# Patient Record
Sex: Female | Born: 1940 | Race: White | Hispanic: No | Marital: Married | Smoking: Never smoker
Health system: Southern US, Community
[De-identification: ages and names within clinical notes are randomized; demographics above are authoritative.]

## PROBLEM LIST (undated history)

## (undated) DIAGNOSIS — J309 Allergic rhinitis, unspecified: Secondary | ICD-10-CM

## (undated) DIAGNOSIS — C801 Malignant (primary) neoplasm, unspecified: Secondary | ICD-10-CM

## (undated) DIAGNOSIS — M8580 Other specified disorders of bone density and structure, unspecified site: Secondary | ICD-10-CM

## (undated) DIAGNOSIS — I10 Essential (primary) hypertension: Secondary | ICD-10-CM

## (undated) DIAGNOSIS — Z85850 Personal history of malignant neoplasm of thyroid: Secondary | ICD-10-CM

## (undated) DIAGNOSIS — T884XXA Failed or difficult intubation, initial encounter: Secondary | ICD-10-CM

## (undated) DIAGNOSIS — R0600 Dyspnea, unspecified: Secondary | ICD-10-CM

## (undated) DIAGNOSIS — K219 Gastro-esophageal reflux disease without esophagitis: Secondary | ICD-10-CM

## (undated) DIAGNOSIS — T8859XA Other complications of anesthesia, initial encounter: Secondary | ICD-10-CM

## (undated) DIAGNOSIS — T4145XA Adverse effect of unspecified anesthetic, initial encounter: Secondary | ICD-10-CM

## (undated) DIAGNOSIS — E785 Hyperlipidemia, unspecified: Secondary | ICD-10-CM

## (undated) HISTORY — DX: Hyperlipidemia, unspecified: E78.5

## (undated) HISTORY — DX: Other specified disorders of bone density and structure, unspecified site: M85.80

## (undated) HISTORY — PX: TONSILLECTOMY: SUR1361

## (undated) HISTORY — PX: INNER EAR SURGERY: SHX679

## (undated) HISTORY — DX: Allergic rhinitis, unspecified: J30.9

---

## 1978-03-15 HISTORY — PX: ABDOMINAL HYSTERECTOMY: SHX81

## 1984-11-13 HISTORY — PX: CHOLECYSTECTOMY: SHX55

## 2012-03-15 HISTORY — PX: COLONOSCOPY: SHX174

## 2015-04-21 ENCOUNTER — Other Ambulatory Visit: Payer: Self-pay | Admitting: Endocrinology

## 2015-04-21 DIAGNOSIS — E049 Nontoxic goiter, unspecified: Secondary | ICD-10-CM

## 2015-05-09 ENCOUNTER — Ambulatory Visit
Admission: RE | Admit: 2015-05-09 | Discharge: 2015-05-09 | Disposition: A | Discharge status: Home / Self Care | Payer: Self-pay | Source: Ambulatory Visit | Attending: Endocrinology | Admitting: Endocrinology

## 2015-05-09 DIAGNOSIS — E049 Nontoxic goiter, unspecified: Secondary | ICD-10-CM

## 2015-07-22 ENCOUNTER — Other Ambulatory Visit: Payer: Self-pay | Admitting: Endocrinology

## 2015-07-22 DIAGNOSIS — E063 Autoimmune thyroiditis: Secondary | ICD-10-CM

## 2015-07-22 DIAGNOSIS — E042 Nontoxic multinodular goiter: Secondary | ICD-10-CM

## 2015-07-25 ENCOUNTER — Inpatient Hospital Stay: Admission: RE | Admit: 2015-07-25 | Payer: Self-pay | Source: Ambulatory Visit

## 2015-07-25 ENCOUNTER — Encounter (INDEPENDENT_AMBULATORY_CARE_PROVIDER_SITE_OTHER): Payer: Self-pay

## 2015-07-25 ENCOUNTER — Ambulatory Visit
Admission: RE | Admit: 2015-07-25 | Discharge: 2015-07-25 | Disposition: A | Discharge status: Home / Self Care | Payer: Medicare Other | Source: Ambulatory Visit | Attending: Endocrinology | Admitting: Endocrinology

## 2015-07-25 ENCOUNTER — Other Ambulatory Visit (HOSPITAL_COMMUNITY)
Admission: RE | Admit: 2015-07-25 | Discharge: 2015-07-25 | Disposition: A | Discharge status: Home / Self Care | Payer: Medicare Other | Source: Ambulatory Visit | Attending: Radiology | Admitting: Radiology

## 2015-07-25 DIAGNOSIS — E063 Autoimmune thyroiditis: Secondary | ICD-10-CM

## 2015-07-25 DIAGNOSIS — E042 Nontoxic multinodular goiter: Secondary | ICD-10-CM | POA: Diagnosis present

## 2015-07-25 DIAGNOSIS — C73 Malignant neoplasm of thyroid gland: Secondary | ICD-10-CM | POA: Insufficient documentation

## 2015-07-30 ENCOUNTER — Inpatient Hospital Stay: Admission: RE | Admit: 2015-07-30 | Payer: Self-pay | Source: Ambulatory Visit

## 2015-08-19 ENCOUNTER — Ambulatory Visit: Payer: Self-pay | Admitting: Surgery

## 2015-08-21 ENCOUNTER — Encounter (HOSPITAL_COMMUNITY): Payer: Self-pay

## 2015-08-21 NOTE — Progress Notes (Signed)
Anesthesia records obtained from surgeries 2014, 2016 obtained and review by Dr Seward Speck who stated they did not need to see pt at PST visit sine records reviewed. Placed on chart

## 2015-08-21 NOTE — Patient Instructions (Addendum)
Kimberlly Hubanks  08/21/2015   Your procedure is scheduled on: 08/28/15 Thursday  Report to [facility] Main  Entrance take [facility]  elevators to 3rd floor to  [place] @  0800 AM.  Call this number if you have problems the morning of surgery [phone]   Remember: ONLY 1 PERSON MAY GO WITH YOU TO SHORT STAY TO GET  READY MORNING OF [place].  Do not eat food or drink liquids :After Midnight. Wednesday night   STOP ASPIRIN, ANTIINFLAMMATORIES AND HERBAL MEDICINE 5 DAYS BEFORE SURGERY AS PER DR GERKINS ORDER  Take these medicines the morning of surgery with A SIP OF WATER: ZANTAC,   May take Allegra, may use refresh eye drops if needed DO NOT [place] may not have any metal on your body including hair pins and              piercings  Do not wear jewelry, make-up, lotions, powders or perfumes, deodorant             Do not wear nail polish.  Do not shave  48 hours prior to surgery.              Men may shave face and neck.   Do not bring valuables to the hospital. [place].  Contacts, dentures or bridgework may not be worn into surgery.  Leave suitcase in the car. After surgery it may be brought to your room.     Patients discharged the day of surgery will not be allowed to drive home.  Name and phone number of your driver:husband- Phil  Special Instructions: N/A              Please read over the following fact sheets you were given: _____________________________________________________________________             [facility] - Preparing for Surgery Before surgery, you can play an important role.  Because skin is not sterile, your skin needs to be as free of germs as possible.  You can reduce the number of germs on your skin by washing with CHG (chlorahexidine gluconate) soap before surgery.  CHG is an antiseptic cleaner which  kills germs and bonds with the skin to continue killing germs even after washing. Please DO NOT use if you have an allergy to CHG or antibacterial soaps.  If your skin becomes reddened/irritated stop using the CHG and inform your nurse when you arrive at Short Stay. Do not shave (including legs and underarms) for at least 48 hours prior to the first CHG shower.  You may shave your face/neck. Please follow these instructions carefully:  1.  Shower with CHG Soap the night before surgery and the  morning of Surgery.  2.  If you choose to wash your hair, wash your hair first as usual with your  normal  shampoo.  3.  After you shampoo, rinse your hair and body thoroughly to remove the  shampoo.  4.  Use CHG as you would any other liquid soap.  You can apply chg directly  to the skin and wash                       Gently with a scrungie or clean washcloth.  5.  Apply the CHG Soap to your body ONLY FROM THE NECK DOWN.   Do not use on face/ open                           Wound or open sores. Avoid contact with eyes, ears mouth and genitals (private parts).                       Wash face,  Genitals (private parts) with your normal soap.             6.  Wash thoroughly, paying special attention to the area where your surgery  will be performed.  7.  Thoroughly rinse your body with warm water from the neck down.  8.  DO NOT shower/wash with your normal soap after using and rinsing off  the CHG Soap.                9.  Pat yourself dry with a clean towel.            10.  Wear clean pajamas.            11.  Place clean sheets on your bed the night of your first shower and do not  sleep with pets. Day of Surgery : Do not apply any lotions/deodorants the morning of surgery.  Please wear clean clothes to the hospital/surgery center.  FAILURE TO FOLLOW THESE INSTRUCTIONS MAY RESULT IN THE CANCELLATION OF YOUR SURGERY PATIENT SIGNATURE_________________________________  NURSE  SIGNATURE__________________________________  ________________________________________________________________________

## 2015-08-25 ENCOUNTER — Encounter (HOSPITAL_COMMUNITY): Payer: Self-pay

## 2015-08-25 ENCOUNTER — Ambulatory Visit (HOSPITAL_COMMUNITY)
Admission: RE | Admit: 2015-08-25 | Discharge: 2015-08-25 | Disposition: A | Discharge status: Home / Self Care | Payer: Medicare Other | Source: Ambulatory Visit | Attending: Anesthesiology | Admitting: Anesthesiology

## 2015-08-25 ENCOUNTER — Encounter (HOSPITAL_COMMUNITY)
Admission: RE | Admit: 2015-08-25 | Discharge: 2015-08-25 | Disposition: A | Discharge status: Home / Self Care | Payer: Medicare Other | Source: Ambulatory Visit | Attending: Surgery | Admitting: Surgery

## 2015-08-25 DIAGNOSIS — Z01818 Encounter for other preprocedural examination: Secondary | ICD-10-CM | POA: Diagnosis not present

## 2015-08-25 DIAGNOSIS — I10 Essential (primary) hypertension: Secondary | ICD-10-CM | POA: Insufficient documentation

## 2015-08-25 HISTORY — DX: Malignant (primary) neoplasm, unspecified: C80.1

## 2015-08-25 HISTORY — DX: Adverse effect of unspecified anesthetic, initial encounter: T41.45XA

## 2015-08-25 HISTORY — DX: Essential (primary) hypertension: I10

## 2015-08-25 HISTORY — DX: Gastro-esophageal reflux disease without esophagitis: K21.9

## 2015-08-25 HISTORY — DX: Other complications of anesthesia, initial encounter: T88.59XA

## 2015-08-25 LAB — BASIC METABOLIC PANEL
Anion gap: 7 (ref 5–15)
BUN: 19 mg/dL (ref 6–20)
CO2: 26 mmol/L (ref 22–32)
CREATININE: 0.56 mg/dL (ref 0.44–1.00)
Calcium: 9.8 mg/dL (ref 8.9–10.3)
Chloride: 108 mmol/L (ref 101–111)
GFR calc Af Amer: >60 mL/min (ref >60)
GFR calc non Af Amer: >60 mL/min (ref >60)
GLUCOSE: 99 mg/dL (ref 65–99)
Potassium: 4 mmol/L (ref 3.5–5.1)
SODIUM: 141 mmol/L (ref 135–145)

## 2015-08-25 LAB — CBC
HCT: 41.4 % (ref 36.0–46.0)
Hemoglobin: 12.8 g/dL (ref 12.0–15.0)
MCH: 28.2 pg (ref 26.0–34.0)
MCHC: 30.9 g/dL (ref 30.0–36.0)
MCV: 91.2 fL (ref 78.0–100.0)
PLATELETS: 173 10*3/uL (ref 150–400)
RBC: 4.54 MIL/uL (ref 3.87–5.11)
RDW: 14.4 % (ref 11.5–15.5)
WBC: 7.2 10*3/uL (ref 4.0–10.5)

## 2015-08-27 ENCOUNTER — Encounter (HOSPITAL_COMMUNITY): Payer: Self-pay | Admitting: Surgery

## 2015-08-27 DIAGNOSIS — C73 Malignant neoplasm of thyroid gland: Secondary | ICD-10-CM | POA: Diagnosis present

## 2015-08-27 DIAGNOSIS — E042 Nontoxic multinodular goiter: Secondary | ICD-10-CM | POA: Diagnosis present

## 2015-08-27 NOTE — H&P (Signed)
General Surgery [facility]  Haley [name] DOB: Apr 02, 1940 Married / Language: English / Race: White Female  History of Present Illness  The patient is a 75 year old female who presents with thyroid cancer.  Patient is referred by Dr. Legrand Como [place] 4 evaluation and treatment of papillary thyroid carcinoma. Patient had been noted with abnormal thyroid function test by her primary care physician, Dr. Kennith Maes in [facility]. Patient was referred to her endocrinologist for further evaluation and recommendations. Patient was sent for thyroid ultrasound in February 2017. She was noted to have bilateral thyroid nodules. A nodule on the left side measured 1.9 cm in size, was hypoechoic, and contained internal dystrophic calcifications. Biopsy was recommended. Biopsy was performed on Jul 25, 2015. This was consistent with papillary thyroid carcinoma, Bethesda category VI. Patient has no prior history of thyroid disease. She has had previous bilateral ear surgery by Dr. Vicie Mutters. She has never been on thyroid medication. There is no family history of thyroid cancer. Patient denies tremors or palpitations. She has no compressive symptoms. She denies hoarseness. Patient is accompanied by her husband who is a previous patient of mine having undergone parathyroidectomy many years ago.   Other Problems Cancer Cholelithiasis High blood pressure Hypercholesterolemia Migraine Headache Thyroid Disease  Past Surgical History Appendectomy Gallbladder Surgery - Open Hysterectomy (not due to cancer) - Complete Tonsillectomy  Diagnostic Studies History Colonoscopy 1-5 years ago Mammogram within last year Pap Smear >5 years ago  Allergies Codeine Sulfate *ANALGESICS - OPIOID* Tetanus Toxoid Adsorbed *Toxoids**  Medication History Captopril (25MG  Tablet, Oral) Active. Allegra (180MG  Tablet, Oral) Active. Crestor (20MG   Tablet, Oral) Active. Vitamin D (Ergocalciferol) (50000UNIT Capsule, Oral) Active. Minocycline HCl (100MG  Tablet, Oral) Active. Medications Reconciled  Social History Caffeine use Carbonated beverages, Coffee, Tea. No alcohol use No drug use Tobacco use Never smoker.  Family History Heart Disease Father.  Pregnancy / Birth History Age at menarche 59 years. Age of menopause <46 Gravida 4 Maternal age 109-25 Para 4  Review of Systems General Not Present- Appetite Loss, Chills, Fatigue, Fever, Night Sweats, Weight Gain and Weight Loss. Skin Not Present- Change in Wart/Mole, Dryness, Hives, Jaundice, New Lesions, Non-Healing Wounds, Rash and Ulcer. HEENT Present- Seasonal Allergies. Not Present- Earache, Hearing Loss, Hoarseness, Nose Bleed, Oral Ulcers, Ringing in the Ears, Sinus Pain, Sore Throat, Visual Disturbances, Wears glasses/contact lenses and Yellow Eyes. Respiratory Not Present- Bloody sputum, Chronic Cough, Difficulty Breathing, Snoring and Wheezing. Breast Not Present- Breast Mass, Breast Pain, Nipple Discharge and Skin Changes. Cardiovascular Not Present- Chest Pain, Difficulty Breathing Lying Down, Leg Cramps, Palpitations, Rapid Heart Rate, Shortness of Breath and Swelling of Extremities. Gastrointestinal Not Present- Abdominal Pain, Bloating, Bloody Stool, Change in Bowel Habits, Chronic diarrhea, Constipation, Difficulty Swallowing, Excessive gas, Gets full quickly at meals, Hemorrhoids, Indigestion, Nausea, Rectal Pain and Vomiting. Female Genitourinary Not Present- Frequency, Nocturia, Painful Urination, Pelvic Pain and Urgency. Musculoskeletal Not Present- Back Pain, Joint Pain, Joint Stiffness, Muscle Pain, Muscle Weakness and Swelling of Extremities. Neurological Not Present- Decreased Memory, Fainting, Headaches, Numbness, Seizures, Tingling, Tremor, Trouble walking and Weakness. Psychiatric Not Present- Anxiety, Bipolar, Change in Sleep Pattern,  Depression, Fearful and Frequent crying. Endocrine Not Present- Cold Intolerance, Excessive Hunger, Hair Changes, Heat Intolerance, Hot flashes and New Diabetes. Hematology Not Present- Easy Bruising, Excessive bleeding, Gland problems, HIV and Persistent Infections.  Vitals Weight: 155 lb Height: 66in Body Surface Area: 1.79 m Body Mass Index: 25.02 kg/m  Temp.: 97.66F  Pulse:  76 (Regular)  BP: 130/70 (Sitting, Left Arm, Standard)  Physical Exam  General - appears comfortable, no distress; not diaphorectic  HEENT - normocephalic; sclerae clear, gaze conjugate; mucous membranes moist, dentition good; voice normal  Neck - asymmetric on extension; no palpable anterior or posterior cervical adenopathy; right lobe is slightly firm and mildly nodular without dominant or discrete mass; left lobe has a firm hard discrete mass in the inferior pole which is nontender, measures approximately 2-2.5 cm in size, and is mobile with swallowing  Chest - clear bilaterally without rhonchi, rales, or wheeze  Cor - regular rhythm with normal rate; no significant murmur  Ext - non-tender without significant edema or lymphedema  Neuro - grossly intact; no tremor  Assessment & Plan  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 Patient and her husband are provided with written literature on thyroid surgery to review at home.  Patient has a papillary thyroid carcinoma arising in the lower left thyroid lobe. She has multiple nodules throughout the thyroid. I have recommended proceeding with total thyroidectomy with limited central compartment left node dissection. Based on the pathologic results, we will make a decision with her endocrinologist as to whether or not radioactive iodine treatment is necessary. We discussed the procedure at length. We discussed risk and  benefits including the potential for recurrent laryngeal nerve injury and injury to parathyroid glands. We discussed the location of the surgical incision. We discussed the hospital stay. We discussed her postoperative recovery and need for lifelong thyroid hormone replacement. We discussed the potential need for radioactive iodine treatment. Patient and her husband understand and wish to proceed with surgery in the near future.  The risks and benefits of the procedure have been discussed at length with the patient. The patient understands the proposed procedure, potential alternative treatments, and the course of recovery to be expected. All of the patient's questions have been answered at this time. The patient wishes to proceed with surgery.  Earnstine Regal, MD, [facility] Office: [phone]

## 2015-08-28 ENCOUNTER — Observation Stay (HOSPITAL_COMMUNITY)
Admission: RE | Admit: 2015-08-28 | Discharge: 2015-08-29 | Disposition: A | Discharge status: Home / Self Care | Payer: Medicare Other | Source: Ambulatory Visit | Attending: Surgery | Admitting: Surgery

## 2015-08-28 ENCOUNTER — Encounter (HOSPITAL_COMMUNITY)
Admission: RE | Disposition: A | Discharge status: Home / Self Care | Payer: Self-pay | Source: Ambulatory Visit | Attending: Surgery

## 2015-08-28 ENCOUNTER — Ambulatory Visit (HOSPITAL_COMMUNITY): Payer: Medicare Other | Admitting: Anesthesiology

## 2015-08-28 ENCOUNTER — Encounter (HOSPITAL_COMMUNITY): Payer: Self-pay | Admitting: *Deleted

## 2015-08-28 DIAGNOSIS — Z79899 Other long term (current) drug therapy: Secondary | ICD-10-CM | POA: Diagnosis not present

## 2015-08-28 DIAGNOSIS — C73 Malignant neoplasm of thyroid gland: Secondary | ICD-10-CM | POA: Diagnosis present

## 2015-08-28 DIAGNOSIS — E042 Nontoxic multinodular goiter: Secondary | ICD-10-CM | POA: Diagnosis present

## 2015-08-28 DIAGNOSIS — K219 Gastro-esophageal reflux disease without esophagitis: Secondary | ICD-10-CM | POA: Diagnosis not present

## 2015-08-28 DIAGNOSIS — C770 Secondary and unspecified malignant neoplasm of lymph nodes of head, face and neck: Secondary | ICD-10-CM | POA: Diagnosis not present

## 2015-08-28 DIAGNOSIS — I10 Essential (primary) hypertension: Secondary | ICD-10-CM | POA: Insufficient documentation

## 2015-08-28 DIAGNOSIS — E7800 Pure hypercholesterolemia, unspecified: Secondary | ICD-10-CM | POA: Diagnosis not present

## 2015-08-28 HISTORY — PX: THYROIDECTOMY: SHX17

## 2015-08-28 SURGERY — THYROIDECTOMY
Anesthesia: General | Site: Neck

## 2015-08-28 MED ORDER — FENTANYL CITRATE (PF) 250 MCG/5ML IJ SOLN
INTRAMUSCULAR | Status: AC
Start: 2015-08-28 — End: 2015-08-28
  Filled 2015-08-28: qty 5

## 2015-08-28 MED ORDER — PROPOFOL 10 MG/ML IV BOLUS
INTRAVENOUS | Status: DC | PRN
  Administered 2015-08-28: 150 mg via INTRAVENOUS
  Administered 2015-08-28: 90 mg via INTRAVENOUS

## 2015-08-28 MED ORDER — CEFAZOLIN SODIUM-DEXTROSE 2-4 GM/100ML-% IV SOLN
2.0000 g | INTRAVENOUS | Status: AC
  Administered 2015-08-28: 2 g via INTRAVENOUS

## 2015-08-28 MED ORDER — MIDAZOLAM HCL 5 MG/5ML IJ SOLN
INTRAMUSCULAR | Status: DC | PRN
  Administered 2015-08-28 (×2): 1 mg via INTRAVENOUS

## 2015-08-28 MED ORDER — ONDANSETRON HCL 4 MG/2ML IJ SOLN
INTRAMUSCULAR | Status: DC | PRN
  Administered 2015-08-28: 4 mg via INTRAVENOUS

## 2015-08-28 MED ORDER — HYDROMORPHONE HCL 1 MG/ML IJ SOLN
1.0000 mg | INTRAMUSCULAR | Status: DC | PRN
  Administered 2015-08-28 (×2): 1 mg via INTRAVENOUS
  Filled 2015-08-28 (×2): qty 1

## 2015-08-28 MED ORDER — FENTANYL CITRATE (PF) 250 MCG/5ML IJ SOLN
INTRAMUSCULAR | Status: AC
  Filled 2015-08-28: qty 5

## 2015-08-28 MED ORDER — ACETAMINOPHEN 10 MG/ML IV SOLN
INTRAVENOUS | Status: DC | PRN
  Administered 2015-08-28: 1000 mg via INTRAVENOUS

## 2015-08-28 MED ORDER — FENTANYL CITRATE (PF) 100 MCG/2ML IJ SOLN
INTRAMUSCULAR | Status: DC | PRN
  Administered 2015-08-28 (×5): 50 ug via INTRAVENOUS
  Administered 2015-08-28 (×2): 100 ug via INTRAVENOUS
  Administered 2015-08-28: 50 ug via INTRAVENOUS

## 2015-08-28 MED ORDER — LABETALOL HCL 5 MG/ML IV SOLN
INTRAVENOUS | Status: AC
  Filled 2015-08-28: qty 4

## 2015-08-28 MED ORDER — 0.9 % SODIUM CHLORIDE (POUR BTL) OPTIME
TOPICAL | Status: DC | PRN
  Administered 2015-08-28: 1000 mL

## 2015-08-28 MED ORDER — LACTATED RINGERS IV SOLN
INTRAVENOUS | Status: DC
  Administered 2015-08-28: 1000 mL via INTRAVENOUS

## 2015-08-28 MED ORDER — SUGAMMADEX SODIUM 200 MG/2ML IV SOLN
INTRAVENOUS | Status: DC | PRN
  Administered 2015-08-28: 150 mg via INTRAVENOUS

## 2015-08-28 MED ORDER — ACETAMINOPHEN 325 MG PO TABS
650.0000 mg | ORAL_TABLET | Freq: Four times a day (QID) | ORAL | Status: DC | PRN
  Administered 2015-08-29: 650 mg via ORAL
  Filled 2015-08-28 (×2): qty 2

## 2015-08-28 MED ORDER — SUGAMMADEX SODIUM 200 MG/2ML IV SOLN
INTRAVENOUS | Status: AC
  Filled 2015-08-28: qty 2

## 2015-08-28 MED ORDER — ACETAMINOPHEN 10 MG/ML IV SOLN
INTRAVENOUS | Status: AC
  Filled 2015-08-28: qty 100

## 2015-08-28 MED ORDER — CALCIUM CARBONATE 1250 (500 CA) MG PO TABS
2.0000 | ORAL_TABLET | Freq: Three times a day (TID) | ORAL | Status: DC
  Administered 2015-08-28 – 2015-08-29 (×3): 1000 mg via ORAL
  Filled 2015-08-28 (×5): qty 2

## 2015-08-28 MED ORDER — PHENYLEPHRINE HCL 10 MG/ML IJ SOLN
INTRAMUSCULAR | Status: DC | PRN
  Administered 2015-08-28: 80 ug via INTRAVENOUS

## 2015-08-28 MED ORDER — MIDAZOLAM HCL 2 MG/2ML IJ SOLN
INTRAMUSCULAR | Status: AC
  Filled 2015-08-28: qty 2

## 2015-08-28 MED ORDER — PROPOFOL 10 MG/ML IV BOLUS
INTRAVENOUS | Status: AC
  Filled 2015-08-28: qty 40

## 2015-08-28 MED ORDER — CEFAZOLIN SODIUM-DEXTROSE 2-4 GM/100ML-% IV SOLN
INTRAVENOUS | Status: AC
  Filled 2015-08-28: qty 100

## 2015-08-28 MED ORDER — ROCURONIUM BROMIDE 100 MG/10ML IV SOLN
INTRAVENOUS | Status: AC
  Filled 2015-08-28: qty 1

## 2015-08-28 MED ORDER — ONDANSETRON HCL 4 MG/2ML IJ SOLN: 4.0000 mg | Freq: Four times a day (QID) | INTRAMUSCULAR | Status: DC | PRN

## 2015-08-28 MED ORDER — HYDROMORPHONE HCL 1 MG/ML IJ SOLN
INTRAMUSCULAR | Status: AC
  Filled 2015-08-28: qty 1

## 2015-08-28 MED ORDER — HYDROMORPHONE HCL 1 MG/ML IJ SOLN
0.2500 mg | INTRAMUSCULAR | Status: DC | PRN
  Administered 2015-08-28 (×2): 0.5 mg via INTRAVENOUS

## 2015-08-28 MED ORDER — LABETALOL HCL 5 MG/ML IV SOLN
INTRAVENOUS | Status: DC | PRN
  Administered 2015-08-28 (×3): 2.5 mg via INTRAVENOUS
  Administered 2015-08-28 (×2): 5 mg via INTRAVENOUS

## 2015-08-28 MED ORDER — ROCURONIUM BROMIDE 100 MG/10ML IV SOLN
INTRAVENOUS | Status: DC | PRN
  Administered 2015-08-28: 20 mg via INTRAVENOUS
  Administered 2015-08-28: 30 mg via INTRAVENOUS

## 2015-08-28 MED ORDER — KCL IN DEXTROSE-NACL 20-5-0.45 MEQ/L-%-% IV SOLN
INTRAVENOUS | Status: DC
  Administered 2015-08-28 – 2015-08-29 (×2): via INTRAVENOUS
  Filled 2015-08-28 (×2): qty 1000

## 2015-08-28 MED ORDER — LACTATED RINGERS IV SOLN: INTRAVENOUS | Status: DC

## 2015-08-28 MED ORDER — DEXAMETHASONE SODIUM PHOSPHATE 10 MG/ML IJ SOLN
INTRAMUSCULAR | Status: AC
  Filled 2015-08-28: qty 1

## 2015-08-28 MED ORDER — ONDANSETRON HCL 4 MG/2ML IJ SOLN
INTRAMUSCULAR | Status: AC
  Filled 2015-08-28: qty 2

## 2015-08-28 MED ORDER — CAPTOPRIL 25 MG PO TABS
25.0000 mg | ORAL_TABLET | Freq: Three times a day (TID) | ORAL | Status: DC
  Administered 2015-08-28 – 2015-08-29 (×4): 25 mg via ORAL
  Filled 2015-08-28 (×5): qty 1

## 2015-08-28 MED ORDER — FENTANYL CITRATE (PF) 100 MCG/2ML IJ SOLN: 25.0000 ug | INTRAMUSCULAR | Status: DC | PRN

## 2015-08-28 MED ORDER — LIDOCAINE HCL (CARDIAC) 20 MG/ML IV SOLN
INTRAVENOUS | Status: AC
  Filled 2015-08-28: qty 5

## 2015-08-28 MED ORDER — SUCCINYLCHOLINE CHLORIDE 20 MG/ML IJ SOLN
INTRAMUSCULAR | Status: DC | PRN
  Administered 2015-08-28: 100 mg via INTRAVENOUS
  Administered 2015-08-28: 40 mg via INTRAVENOUS

## 2015-08-28 MED ORDER — LIDOCAINE HCL (CARDIAC) 20 MG/ML IV SOLN
INTRAVENOUS | Status: DC | PRN
  Administered 2015-08-28: 25 mg via INTRATRACHEAL
  Administered 2015-08-28: 150 mg via INTRAVENOUS

## 2015-08-28 MED ORDER — ONDANSETRON 4 MG PO TBDP: 4.0000 mg | ORAL_TABLET | Freq: Four times a day (QID) | ORAL | Status: DC | PRN

## 2015-08-28 MED ORDER — DEXAMETHASONE SODIUM PHOSPHATE 10 MG/ML IJ SOLN
INTRAMUSCULAR | Status: DC | PRN
  Administered 2015-08-28: 10 mg via INTRAVENOUS

## 2015-08-28 MED ORDER — HYDROCODONE-ACETAMINOPHEN 5-325 MG PO TABS
1.0000 | ORAL_TABLET | ORAL | Status: DC | PRN
  Administered 2015-08-28 – 2015-08-29 (×2): 2 via ORAL
  Filled 2015-08-28 (×2): qty 2

## 2015-08-28 MED ORDER — ACETAMINOPHEN 650 MG RE SUPP: 650.0000 mg | Freq: Four times a day (QID) | RECTAL | Status: DC | PRN

## 2015-08-28 MED ORDER — LACTATED RINGERS IV SOLN
INTRAVENOUS | Status: DC | PRN
  Administered 2015-08-28 (×2): via INTRAVENOUS

## 2015-08-28 SURGICAL SUPPLY — 39 items
ATTRACTOMAT 16X20 MAGNETIC DRP (DRAPES) ×3 IMPLANT
BENZOIN TINCTURE PRP APPL 2/3 (GAUZE/BANDAGES/DRESSINGS) ×3 IMPLANT
BLADE HEX COATED 2.75 (ELECTRODE) ×3 IMPLANT
BLADE SURG 15 STRL LF DISP TIS (BLADE) ×1 IMPLANT
BLADE SURG 15 STRL SS (BLADE) ×2
CHLORAPREP W/TINT 26ML (MISCELLANEOUS) ×3 IMPLANT
CLIP TI MEDIUM 6 (CLIP) ×6 IMPLANT
CLIP TI WIDE RED SMALL 6 (CLIP) ×9 IMPLANT
CLOSURE WOUND 1/2 X4 (GAUZE/BANDAGES/DRESSINGS) ×1
COVER SURGICAL LIGHT HANDLE (MISCELLANEOUS) ×3 IMPLANT
DISSECTOR ROUND CHERRY 3/8 STR (MISCELLANEOUS) IMPLANT
DRAPE LAPAROTOMY T 98X78 PEDS (DRAPES) ×3 IMPLANT
DRESSING SURGICEL FIBRLLR 1X2 (HEMOSTASIS) ×1 IMPLANT
DRSG SURGICEL FIBRILLAR 1X2 (HEMOSTASIS) ×3
ELECT PENCIL ROCKER SW 15FT (MISCELLANEOUS) ×3 IMPLANT
ELECT REM PT RETURN 9FT ADLT (ELECTROSURGICAL) ×3
ELECTRODE REM PT RTRN 9FT ADLT (ELECTROSURGICAL) ×1 IMPLANT
GAUZE SPONGE 4X4 12PLY STRL (GAUZE/BANDAGES/DRESSINGS) ×3 IMPLANT
GAUZE SPONGE 4X4 16PLY XRAY LF (GAUZE/BANDAGES/DRESSINGS) ×6 IMPLANT
GLOVE SURG ORTHO 8.0 STRL STRW (GLOVE) ×3 IMPLANT
GOWN STRL REUS W/TWL XL LVL3 (GOWN DISPOSABLE) ×6 IMPLANT
KIT BASIN OR (CUSTOM PROCEDURE TRAY) ×3 IMPLANT
LIGHT WAVEGUIDE WIDE FLAT (MISCELLANEOUS) ×3 IMPLANT
LIQUID BAND (GAUZE/BANDAGES/DRESSINGS) IMPLANT
PACK BASIC VI WITH GOWN DISP (CUSTOM PROCEDURE TRAY) ×3 IMPLANT
SHEARS HARMONIC 9CM CVD (BLADE) ×3 IMPLANT
STAPLER VISISTAT 35W (STAPLE) IMPLANT
STRIP CLOSURE SKIN 1/2X4 (GAUZE/BANDAGES/DRESSINGS) ×2 IMPLANT
SUT MNCRL AB 4-0 PS2 18 (SUTURE) ×3 IMPLANT
SUT SILK 2 0 (SUTURE)
SUT SILK 2-0 18XBRD TIE 12 (SUTURE) IMPLANT
SUT SILK 3 0 (SUTURE)
SUT SILK 3-0 18XBRD TIE 12 (SUTURE) IMPLANT
SUT VIC AB 3-0 SH 18 (SUTURE) ×6 IMPLANT
SYR BULB IRRIGATION 50ML (SYRINGE) ×3 IMPLANT
TAPE CLOTH SURG 4X10 WHT LF (GAUZE/BANDAGES/DRESSINGS) ×3 IMPLANT
TOWEL OR 17X26 10 PK STRL BLUE (TOWEL DISPOSABLE) ×3 IMPLANT
TOWEL OR NON WOVEN STRL DISP B (DISPOSABLE) ×3 IMPLANT
YANKAUER SUCT BULB TIP 10FT TU (MISCELLANEOUS) ×3 IMPLANT

## 2015-08-28 NOTE — Anesthesia Postprocedure Evaluation (Signed)
Anesthesia Post Note  Patient: Haley [name]  Procedure(s) Performed: Procedure(s) (LRB): TOTAL THYROIDECTOMY WITH LIMITED LYMPH NODE DISSECTION (N/A)  Patient location during evaluation: PACU Anesthesia Type: General Level of consciousness: awake and alert Pain management: pain level controlled Vital Signs Assessment: post-procedure vital signs reviewed and stable Respiratory status: spontaneous breathing, nonlabored ventilation, respiratory function stable and patient connected to nasal cannula oxygen Cardiovascular status: blood pressure returned to baseline and stable Postop Assessment: no signs of nausea or vomiting Anesthetic complications: no    Last Vitals:  Filed Vitals:   08/28/15 1242 08/28/15 1311  BP: 182/76 167/72  Pulse: 73 73  Temp: 36.8 C 36.6 C  Resp: 13 15    Last Pain:  Filed Vitals:   08/28/15 1335  PainSc: 6                  Nancy Arvin L

## 2015-08-28 NOTE — Anesthesia Preprocedure Evaluation (Signed)
Anesthesia Evaluation  Patient identified by MRN, date of birth, ID band Patient awake    Reviewed: Allergy & Precautions, H&P , NPO status , Patient's Chart, lab work & pertinent test results  History of Anesthesia Complications (+) DIFFICULT AIRWAY  Airway Mallampati: II  TM Distance: >3 FB Neck ROM: full    Dental no notable dental hx. (+) Dental Advisory Given, Teeth Intact   Pulmonary neg pulmonary ROS,    Pulmonary exam normal breath sounds clear to auscultation       Cardiovascular Exercise Tolerance: Good hypertension, Pt. on medications Normal cardiovascular exam Rhythm:regular Rate:Normal     Neuro/Psych negative neurological ROS  negative psych ROS   GI/Hepatic negative GI ROS, Neg liver ROS, GERD  Medicated and Controlled,  Endo/Other  negative endocrine ROSThyroid carcinoma  Renal/GU negative Renal ROS  negative genitourinary   Musculoskeletal   Abdominal   Peds  Hematology negative hematology ROS (+)   Anesthesia Other Findings   Reproductive/Obstetrics negative OB ROS                             Anesthesia Physical Anesthesia Plan  ASA: II  Anesthesia Plan: General   Post-op Pain Management:    Induction: Intravenous  Airway Management Planned: Video Laryngoscope Planned  Additional Equipment:   Intra-op Plan:   Post-operative Plan: Extubation in OR  Informed Consent: I have reviewed the patients History and Physical, chart, labs and discussed the procedure including the risks, benefits and alternatives for the proposed anesthesia with the patient or authorized representative who has indicated his/her understanding and acceptance.   Dental Advisory Given  Plan Discussed with: CRNA and Surgeon  Anesthesia Plan Comments:         Anesthesia Quick Evaluation

## 2015-08-28 NOTE — Addendum Note (Signed)
Addendum  created 08/28/15 1434 by Lissa Morales, CRNA   Modules edited: Charges VN

## 2015-08-28 NOTE — Progress Notes (Signed)
Verbal orders to move b/p medication to be due at this time.

## 2015-08-28 NOTE — Progress Notes (Signed)
Patient's b/p continues to be elevated after being admitted to the floor. Patient has b/p medication for 4pm- will page MD to give early.

## 2015-08-28 NOTE — Brief Op Note (Signed)
08/28/2015  11:50 AM  PATIENT:  Haley [name]  75 y.o. 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  PROCEDURE:  Procedure(s): TOTAL THYROIDECTOMY WITH LIMITED LYMPH NODE DISSECTION (N/A)  SURGEON:  Surgeon(s) and Role:    * Armandina Gemma, MD - Primary  PHYSICIAN ASSISTANT:   ASSISTANTS: none   ANESTHESIA:   general  EBL:  Total I/O In: 1000 [I.V.:1000] Out: 100 [Blood:100]  BLOOD ADMINISTERED:none  DRAINS: none   LOCAL MEDICATIONS USED:  NONE  SPECIMEN:  Excision  DISPOSITION OF SPECIMEN:  PATHOLOGY  COUNTS:  YES  TOURNIQUET:  * No tourniquets in log *  DICTATION: .Other Dictation: Dictation Number [id number]  PLAN OF CARE: Admit for overnight observation  PATIENT DISPOSITION:  PACU - hemodynamically stable.   Delay start of Pharmacological VTE agent (>24hrs) due to surgical blood loss or risk of bleeding: yes  Earnstine Regal, MD, [facility] Office: [phone]

## 2015-08-28 NOTE — Transfer of Care (Signed)
Immediate Anesthesia Transfer of Care Note  Patient: Haley [name]  Procedure(s) Performed: Procedure(s): TOTAL THYROIDECTOMY WITH LIMITED LYMPH NODE DISSECTION (N/A)  Patient Location: PACU  Anesthesia Type:General  Level of Consciousness: awake, alert , oriented and patient cooperative  Airway & Oxygen Therapy: Patient Spontanous Breathing and Patient connected to face mask oxygen  Post-op Assessment: Report given to RN, Post -op Vital signs reviewed and stable and Patient moving all extremities X 4  Post vital signs: stable  Last Vitals:  Filed Vitals:   08/28/15 0757  BP: 179/74  Pulse: 82  Temp: 36.6 C  Resp: 18    Last Pain: There were no vitals filed for this visit.    Patients Stated Pain Goal: 3 ([id number])  Complications: No apparent anesthesia complications

## 2015-08-28 NOTE — Progress Notes (Signed)
  Pt admitted to the unit. Pt is stable, alert and oriented per baseline. Oriented to room, staff, and call bell. Educated to call for any assistance. Bed in lowest position, call bell within reach- will continue to monitor. 

## 2015-08-28 NOTE — Anesthesia Procedure Notes (Signed)
Procedure Name: Intubation Date/Time: 08/28/2015 9:43 AM Performed by: Lissa Morales Pre-anesthesia Checklist: Patient identified, Emergency Drugs available, Suction available, Patient being monitored and Timeout performed Patient Re-evaluated:Patient Re-evaluated prior to inductionOxygen Delivery Method: Circle system utilized Preoxygenation: Pre-oxygenation with 100% oxygen Intubation Type: IV induction Ventilation: Mask ventilation without difficulty Laryngoscope Size: Glidescope, Miller, 2 and 4 (Attempted with Miller 2; changed to #4 glidescope ) Grade View: Grade I Tube type: Oral Tube size: 7.0 mm Number of attempts: 2 Airway Equipment and Method: Patient positioned with wedge pillow,  Stylet and Video-laryngoscopy Placement Confirmation: ETT inserted through vocal cords under direct vision,  positive ETCO2 and breath sounds checked- equal and bilateral Secured at: 21 cm Tube secured with: Tape Dental Injury: Teeth and Oropharynx as per pre-operative assessment  Difficulty Due To: Difficulty was anticipated Comments: Attempted intubation with Miller 2 blade; switched to #4 Glidescope. No trauma or bleeding noted. Patient stable throughout induction and intubation. Intubated by Pietro Cassis, SRNA, assisted by Dr. Landry Dyke and Tawni Millers CRNA

## 2015-08-28 NOTE — Progress Notes (Signed)
MD Gerkin's paged concerning patient's b/p- told that he is in surgery and will return call. Awaiting further orders

## 2015-08-28 NOTE — Discharge Instructions (Signed)
[facility] ° °THYROID & PARATHYROID SURGERY:  POST-OP INSTRUCTIONS ° °Always review your discharge instruction sheet from the facility where your surgery was performed. ° °A prescription for pain medication may be given to you upon discharge.  Take your pain medication as prescribed.  If narcotic pain medicine is not needed, then you may take acetaminophen (Tylenol) or ibuprofen (Advil) as needed. ° °Take your usually prescribed medications unless otherwise directed. ° °If you need a refill on your pain medication, please contact your pharmacy. They will contact our office to request authorization.  Prescriptions will not be processed by our office after 5 pm or on weekends. ° °Start with a light diet upon arrival home, such as soup and crackers or toast.  Be sure to drink plenty of fluids daily.  Resume your normal diet the day after surgery. ° °Most patients will experience some swelling and bruising on the chest and neck area.  Ice packs will help.  Swelling and bruising can take several days to resolve.  ° °It is common to experience some constipation after surgery.  Increasing fluid intake and taking a stool softener will usually help or prevent this problem.  A mild laxative (Milk of Magnesia or Miralax) should be taken according to package directions if there has been no bowel movement after 48 hours. ° °You have steri-strips and a gauze dressing over your incision.  You may remove the gauze bandage on the second day after surgery, and you may shower at that time.  Leave your steri-strips (small skin tapes) in place directly over the incision.  These strips should remain on the skin for 5-7 days and then be removed.  You may get them wet in the shower and pat them dry. ° °You may resume regular (light) daily activities beginning the next day - such as daily self-care, walking, climbing stairs - gradually increasing activities as tolerated.  You may have sexual intercourse when it is  comfortable.  Refrain from any heavy lifting or straining until approved by your doctor.  You may drive when you no longer are taking prescription pain medication, you can comfortably wear a seatbelt, and you can safely maneuver your car and apply brakes. ° °You should see your doctor in the office for a follow-up appointment approximately two to three weeks after your surgery.  Make sure that you call for this appointment within a day or two after you arrive home to insure a convenient appointment time. ° °WHEN TO CALL YOUR DOCTOR: °-- Fever greater than 101.5 °-- Inability to urinate °-- Nausea and/or vomiting - persistent °-- Extreme swelling or bruising °-- Continued bleeding from incision °-- Increased pain, redness, or drainage from the incision °-- Difficulty swallowing or breathing °-- Muscle cramping or spasms °-- Numbness or tingling in hands or around lips ° °The clinic staff is available to answer your questions during regular business hours.  Please don’t hesitate to call and ask to speak to one of the nurses if you have concerns. ° °Kesi Perrow M. Alyra Patty, MD, FACS °General & Endocrine Surgery °[facility] °Office: [phone] ° °Website: [email or web address] ° ° °

## 2015-08-28 NOTE — Interval H&P Note (Signed)
History and Physical Interval Note:  08/28/2015 9:20 AM  Haley [name]  has presented today for surgery, with the diagnosis of Papillary thyroid carcinoma.  The various methods of treatment have been discussed with the patient and family. After consideration of risks, benefits and other options for treatment, the patient has consented to    Procedure(s): TOTAL THYROIDECTOMY WITH LIMITED LYMPH NODE DISSECTION (N/A) as a surgical intervention .    The patient's history has been reviewed, patient examined, no change in status, stable for surgery.  I have reviewed the patient's chart and labs.  Questions were answered to the patient's satisfaction.    Earnstine Regal, MD, [facility] Office: [place]

## 2015-08-29 ENCOUNTER — Other Ambulatory Visit (HOSPITAL_COMMUNITY): Payer: Self-pay

## 2015-08-29 DIAGNOSIS — C73 Malignant neoplasm of thyroid gland: Secondary | ICD-10-CM | POA: Diagnosis not present

## 2015-08-29 LAB — BASIC METABOLIC PANEL
ANION GAP: 7 (ref 5–15)
BUN: 14 mg/dL (ref 6–20)
CHLORIDE: 102 mmol/L (ref 101–111)
CO2: 29 mmol/L (ref 22–32)
Calcium: 9.1 mg/dL (ref 8.9–10.3)
Creatinine, Ser: 0.62 mg/dL (ref 0.44–1.00)
GFR calc non Af Amer: >60 mL/min (ref >60)
Glucose, Bld: 157 mg/dL — ABNORMAL HIGH (ref 65–99)
Potassium: 4.3 mmol/L (ref 3.5–5.1)
Sodium: 138 mmol/L (ref 135–145)

## 2015-08-29 MED ORDER — HYDROCODONE-ACETAMINOPHEN 5-325 MG PO TABS: 1.0000 | ORAL_TABLET | ORAL | Status: DC | PRN

## 2015-08-29 MED ORDER — CALCIUM CARBONATE 1250 (500 CA) MG PO TABS: 2.0000 | ORAL_TABLET | Freq: Two times a day (BID) | ORAL | Status: DC

## 2015-08-29 MED ORDER — LEVOTHYROXINE SODIUM 88 MCG PO TABS: 88.0000 ug | ORAL_TABLET | Freq: Every day | ORAL | Status: DC

## 2015-08-29 NOTE — Discharge Summary (Signed)
  Physician Discharge Summary [facility]  Patient ID: Haley [name] MRN: [id number] DOB/AGE: Oct 03, 1940 75 y.o.  Admit date: 08/28/2015 Discharge date: 08/29/2015  Admission Diagnoses:  Papillary thyroid carcinoma  Discharge Diagnoses:  Principal Problem:   Papillary thyroid carcinoma ([place]) Active Problems:   Multiple thyroid nodules   Discharged Condition: good  Hospital Course: Patient was admitted for observation following thyroid surgery.  Post op course was uncomplicated.  Pain was well controlled.  Tolerated diet.  Post op calcium level on morning following surgery was normal.  Patient was prepared for discharge home on POD#1.  Consults: None  Treatments: surgery: total thyroidectomy with limited lymph node dissection  Discharge Exam: Blood pressure 154/59, pulse 70, temperature 98.3 F (36.8 C), temperature source Oral, resp. rate 16, height 5\' 6"  (1.676 m), weight 70.308 kg (155 lb), SpO2 98 %. HEENT - clear Neck - wound dry and intact; moderate hoarseness Chest - clear bilaterally Cor - RRR  Disposition: Home     Medication List    TAKE these medications        CALCIUM + D3 PO  Take 1 tablet by mouth daily.     calcium carbonate 1250 (500 Ca) MG tablet  Commonly known as:  OS-CAL - dosed in mg of elemental calcium  Take 2 tablets (1,000 mg of elemental calcium total) by mouth 2 (two) times daily with a meal.     captopril 25 MG tablet  Commonly known as:  CAPOTEN  Take 25 mg by mouth 3 (three) times daily.     fexofenadine 180 MG tablet  Commonly known as:  ALLEGRA  Take 180 mg by mouth daily.     HYDROcodone-acetaminophen 5-325 MG tablet  Commonly known as:  NORCO/VICODIN  Take 1-2 tablets by mouth every 4 (four) hours as needed for moderate pain.     ibuprofen 200 MG tablet  Commonly known as:  ADVIL,MOTRIN  Take 400 mg by mouth every 6 (six) hours as needed (For pain.).     levothyroxine 88 MCG tablet  Commonly known as:   SYNTHROID  Take 1 tablet (88 mcg total) by mouth daily before breakfast.     ranitidine 75 MG tablet  Commonly known as:  ZANTAC  Take 75 mg by mouth at bedtime as needed for heartburn.     REFRESH TEARS OP  Place 1-2 drops into both eyes as needed (For dry eyes.).     rosuvastatin 20 MG tablet  Commonly known as:  CRESTOR  Take 20 mg by mouth at bedtime.     SCAR GEL EX  Apply 1 application topically at bedtime. Apply to affected area.     Vitamin D (Ergocalciferol) 50000 units Caps capsule  Commonly known as:  DRISDOL  Take 50,000 Units by mouth every Wednesday.           Follow-up Information    Follow up with Earnstine Regal, MD. Schedule an appointment as soon as possible for a visit in 3 weeks.   Specialty:  General Surgery   Why:  For wound re-check   Contact information:   [place] [address] [phone]       Earnstine Regal, MD, [facility] Office: [phone]   Signed: Earnstine Regal 08/29/2015, 4:28 PM

## 2015-08-29 NOTE — Care Management Obs Status (Signed)
[place] NOTIFICATION   Patient Details  Name: Haley [name] MRN: [id number] Date of Birth: Oct 08, 1940   Medicare Observation Status Notification Given:  Yes    Guadalupe Maple, RN 08/29/2015, 3:51 PM

## 2015-08-29 NOTE — Progress Notes (Signed)
Discharge instructions discussed with patient and family, verbalized understanding and agreement.  Prescriptions given to family 

## 2015-08-29 NOTE — Op Note (Signed)
Haley [name], Haley [name]               ACCOUNT NO.:  [id number]  MEDICAL RECORD NO.:  [id number]  LOCATION:  [id number]                         FACILITY:  [facility]  PHYSICIAN:  Earnstine Regal, MD      DATE OF BIRTH:  21-Dec-1940  DATE OF PROCEDURE:  08/28/2015                              OPERATIVE REPORT   PREOPERATIVE DIAGNOSIS:  Papillary thyroid carcinoma.  POSTOPERATIVE DIAGNOSIS:  Papillary thyroid carcinoma, locally invasive with lymph node metastasis.  PROCEDURE: 1. Total thyroidectomy with partial resection of left strap muscles. 2. Limited central compartment lymph node dissection.  SURGEON:  Armandina Gemma, MD, FACS  ANESTHESIA:  General per Dr. Rod Mae.  ESTIMATED BLOOD LOSS:  150 mL.  PREPARATION:  ChloraPrep.  COMPLICATIONS:  None.  INDICATIONS:  The patient is a 75 year old female noted by her primary care physician, Dr. Kennith [name] in [place], [place], to have abnormal thyroid function test.  The patient was referred to Endocrinology and seen by Dr. Legrand Como Altheimer.  After evaluation, an ultrasound was obtained in February 2017 notable for bilateral thyroid nodules.  Nodule in the left lobe measured 1.9 cm and was hypoechoic and contained internal dystrophic calcifications.  Biopsy was performed on Jul 25, 2015, and was consistent with papillary thyroid carcinoma, but that was the category 6.  The patient now comes to Surgery for total thyroidectomy with limited lymph node dissection.  BODY OF REPORT:  Procedure was done in OR #4 to [facility].  The patient was brought to the operating room, placed in supine position on the operating room table.  Following administration of general endotracheal anesthesia, the patient was positioned and then prepped and draped in the usual aseptic fashion.  After ascertaining that an adequate level of anesthesia had been achieved, a Kocher incision was made with a #15 blade.  Dissection was carried  through subcutaneous tissues and platysma.  Hemostasis was achieved with the electrocautery.  Skin flaps were elevated cephalad and caudad from the thyroid notch to the sternal notch and a Mahorner self-retaining retractor was placed for exposure.  Strap muscles were incised in the midline and dissection was begun on the left side.  The left thyroid lobe contains a firm hard nodule in the mid upper pole of the lobe.  It is fixed to the underlying thyroid cartilage and cricoid ring.  There is distortion of the surrounding tissues and involvement of the overlying strap muscles.  The strap muscle was divided at the edge of the tumor cephalad and caudad.  The remaining strap muscle was then reflected laterally and the left lobe was exposed.  Palpation shows a firm hard mass occupying a large part of the upper pole of the left lobe and adherent to the underlying structures.  Inferiorly, there is what appears to be a metastatic lymph node or group of lymph nodes below the inferior pole of the left lobe.  Left lobe was gently mobilized and venous tributaries divided between Ligaclips.  Superior pole was dissected out and superior pole vessels divided between Ligaclips with the Harmonic scalpel.  Gland was rolled anteriorly and branches of the inferior thyroid artery were divided between Ligaclips with the Harmonic scalpel.  The recurrent laryngeal nerve was identified.  It courses on the posterior aspect of the tumor and may be involved with the tumor although direct invasion was not identified.  However, it does course in very close proximity to the tumor and to the area where the tumor was involved with the cricothyroid muscle and the underlying cartilage. Using a Harmonic Scalpel, sharp dissection, and the electrocautery, the lobe was freed from the surrounding structures and hopefully the recurrent laryngeal nerve was spared.  Ligament of Gwenlyn Found was released with the electrocautery and the  gland was rolled further anteriorly. Inferior pole of the lobe was then mobilized with the Harmonic scalpel. Dissection was then carried into the anterior mediastinum from which the involved lymph nodes were excised en block with the thyroid lobes. Vascular structures were divided between Ligaclips with the Harmonic scalpel.  The entire lobe was then mobilized up and onto the anterior trachea.  Dry pack was placed in the left neck.  Next, we turned our attention to the right thyroid lobe.  Strap muscles were again reflected laterally.  Right lobe was enlarged and is violaceous in coloring.  It appears multinodular.  Venous tributaries were divided between Ligaclips with the Harmonic scalpel.  Superior pole vessels were dissected out and divided between medium Ligaclips with the Harmonic scalpel.  Gland was rolled anteriorly.  Branches of the inferior thyroid artery were divided between small Ligaclips with the Harmonic scalpel.  Care was taken to avoid the recurrent laryngeal nerve.  Inferior venous tributaries were divided between Ligaclips and the gland was rolled further anteriorly and onto the anterior surface of the trachea from which it was completely excised with the electrocautery.  Remaining lymph node bearing tissue in zone 6 of the neck was removed en block with the thyroid.  Suture was used to mark the left thyroid lobe at the site of the tumor. A double suture was used to mark the suspected metastatic lymph nodes which are from the central compartment.  The neck was irrigated with warm saline and good hemostasis was achieved throughout the operative field.  Fibrillar was placed throughout the operative field.  Strap muscles were reapproximated in the midline with interrupted 3-0 Vicryl sutures.  Platysma was closed with interrupted 3- 0 Vicryl sutures.  Skin was closed with a running 4-0 Monocryl subcuticular suture.  Wound was washed and dried and benzoin and  Steri-Strips were applied.  Sterile dressings were applied.  The patient was awakened from anesthesia and brought to the recovery room. The patient tolerated the procedure well.   Earnstine Regal, MD, [facility] Office: [phone]   TMG/MEDQ  D:  08/28/2015  T:  08/28/2015  Job:  [id number]  cc:   Haley Maes, MD  Earnstine Regal, MD [address] [address]  Michael D. Altheimer, M.D. Fax: [phone]  Fannie Knee, M.D. Fax: [phone]

## 2015-09-21 ENCOUNTER — Other Ambulatory Visit: Payer: Self-pay | Admitting: Endocrinology

## 2015-09-21 DIAGNOSIS — C73 Malignant neoplasm of thyroid gland: Secondary | ICD-10-CM

## 2015-11-24 ENCOUNTER — Encounter (HOSPITAL_COMMUNITY)
Admission: RE | Admit: 2015-11-24 | Discharge: 2015-11-24 | Disposition: A | Discharge status: Home / Self Care | Payer: Medicare Other | Source: Ambulatory Visit | Attending: Endocrinology | Admitting: Endocrinology

## 2015-11-24 ENCOUNTER — Encounter (HOSPITAL_COMMUNITY): Admission: RE | Admit: 2015-11-24 | Payer: Medicare Other | Source: Ambulatory Visit

## 2015-11-24 DIAGNOSIS — C73 Malignant neoplasm of thyroid gland: Secondary | ICD-10-CM | POA: Diagnosis present

## 2015-11-24 MED ORDER — THYROTROPIN ALFA 1.1 MG IM SOLR
0.9000 mg | INTRAMUSCULAR | Status: AC
  Administered 2015-11-24: 0.9 mg via INTRAMUSCULAR

## 2015-11-25 ENCOUNTER — Encounter (HOSPITAL_COMMUNITY)
Admission: RE | Admit: 2015-11-25 | Discharge: 2015-11-25 | Disposition: A | Discharge status: Home / Self Care | Payer: Medicare Other | Source: Ambulatory Visit | Attending: Endocrinology | Admitting: Endocrinology

## 2015-11-25 ENCOUNTER — Encounter (HOSPITAL_COMMUNITY): Payer: Medicare Other

## 2015-11-25 DIAGNOSIS — C73 Malignant neoplasm of thyroid gland: Secondary | ICD-10-CM | POA: Diagnosis not present

## 2015-11-25 MED ORDER — THYROTROPIN ALFA 1.1 MG IM SOLR
0.9000 mg | INTRAMUSCULAR | Status: AC
  Administered 2015-11-25: 0.9 mg via INTRAMUSCULAR

## 2015-11-26 ENCOUNTER — Encounter (HOSPITAL_COMMUNITY)
Admission: RE | Admit: 2015-11-26 | Discharge: 2015-11-26 | Disposition: A | Discharge status: Home / Self Care | Payer: Medicare Other | Source: Ambulatory Visit | Attending: Endocrinology | Admitting: Endocrinology

## 2015-11-26 ENCOUNTER — Encounter (HOSPITAL_COMMUNITY): Payer: Self-pay

## 2015-11-26 DIAGNOSIS — C73 Malignant neoplasm of thyroid gland: Secondary | ICD-10-CM | POA: Diagnosis not present

## 2015-11-26 MED ORDER — SODIUM IODIDE I 131 CAPSULE
124.8000 | Freq: Once | INTRAVENOUS | Status: AC | PRN
  Administered 2015-11-26: 124.8 via ORAL

## 2015-12-05 ENCOUNTER — Encounter (HOSPITAL_COMMUNITY)
Admission: RE | Admit: 2015-12-05 | Discharge: 2015-12-05 | Disposition: A | Discharge status: Home / Self Care | Payer: Medicare Other | Source: Ambulatory Visit | Attending: Endocrinology | Admitting: Endocrinology

## 2015-12-05 DIAGNOSIS — C73 Malignant neoplasm of thyroid gland: Secondary | ICD-10-CM

## 2015-12-05 MED ORDER — SODIUM IODIDE I 131 CAPSULE
4.0000 | Freq: Once | INTRAVENOUS | Status: AC | PRN
  Administered 2015-12-05: 4 via ORAL

## 2016-04-14 DIAGNOSIS — E063 Autoimmune thyroiditis: Secondary | ICD-10-CM | POA: Insufficient documentation

## 2016-04-14 DIAGNOSIS — E0500 Thyrotoxicosis with diffuse goiter without thyrotoxic crisis or storm: Secondary | ICD-10-CM | POA: Insufficient documentation

## 2016-09-03 ENCOUNTER — Encounter: Payer: Self-pay | Admitting: Physician Assistant

## 2016-09-21 ENCOUNTER — Encounter: Payer: Self-pay | Admitting: Physician Assistant

## 2016-09-21 ENCOUNTER — Encounter (INDEPENDENT_AMBULATORY_CARE_PROVIDER_SITE_OTHER): Payer: Self-pay

## 2016-09-21 ENCOUNTER — Ambulatory Visit (INDEPENDENT_AMBULATORY_CARE_PROVIDER_SITE_OTHER): Payer: Medicare Other | Admitting: Physician Assistant

## 2016-09-21 VITALS — BP 144/58 | HR 84 | Ht 66.0 in | Wt 159.1 lb

## 2016-09-21 DIAGNOSIS — E784 Other hyperlipidemia: Secondary | ICD-10-CM

## 2016-09-21 DIAGNOSIS — I2510 Atherosclerotic heart disease of native coronary artery without angina pectoris: Secondary | ICD-10-CM | POA: Diagnosis not present

## 2016-09-21 DIAGNOSIS — I10 Essential (primary) hypertension: Secondary | ICD-10-CM

## 2016-09-21 DIAGNOSIS — C73 Malignant neoplasm of thyroid gland: Secondary | ICD-10-CM | POA: Diagnosis not present

## 2016-09-21 DIAGNOSIS — I2584 Coronary atherosclerosis due to calcified coronary lesion: Secondary | ICD-10-CM

## 2016-09-21 DIAGNOSIS — E7849 Other hyperlipidemia: Secondary | ICD-10-CM

## 2016-09-21 DIAGNOSIS — E785 Hyperlipidemia, unspecified: Secondary | ICD-10-CM | POA: Insufficient documentation

## 2016-09-21 NOTE — Progress Notes (Signed)
Cardiology Office Note    Date:  09/21/2016   ID:  Haley [name], DOB 10-28-1940, MRN [id number]  PCP:  Ronita Hipps, MD  Cardiologist:  New to Dr. Lovena Le  Chief Complaint: Coronary calcification on CTA  History of Present Illness:   Haley [name] is a 76 y.o. female thyroid cancer status post total thyroidectomy, hypertension and hyperlipidemia referred by Dr. Helene Kelp for evaluation of coronary calcification on CT of chest.  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  Recent chest CT for surveillance showed multiple pulmonary nodules concerning for metastatic disease. Also shown moderate CAD calcification and referred for cardiac evaluation. Patient was seen by Dr. Conley Canal (Otolaryngology) 09/08/16 --> plan for surgery to remove the mass some time after 10/16/16 after patient comes back from [place] trip.   Here today for follow-up. She walks 1 miles 5 times/week and goes [place] 1 times/week. The patient denies nausea, vomiting, fever, chest pain, palpitations, shortness of breath, orthopnea, PND, dizziness, syncope, cough, congestion, abdominal pain, hematochezia, melena, lower extremity edema. Father had MI at age 110. Non smoker and drinker.      Past Medical History:  Diagnosis Date  . Cancer ([place])    basal cell removal x 3  . Complication of anesthesia    hx difficult airway/ 2014, 2016-- records on chart  . GERD (gastroesophageal reflux disease)   . Hypertension     Past Surgical History:  Procedure Laterality Date  . ABDOMINAL HYSTERECTOMY    . CHOLECYSTECTOMY    .  INNER EAR SURGERY Bilateral    2014, 2016  . THYROIDECTOMY N/A 08/28/2015   Procedure: TOTAL THYROIDECTOMY WITH LIMITED LYMPH NODE DISSECTION;  Surgeon: Armandina Gemma, MD;  Location: [facility];  Service: General;  Laterality: N/A;  . TONSILLECTOMY      Current Medications:  Prior to Admission medications   Medication Sig Start Date End Date Taking? Authorizing Provider  aspirin EC 81 MG tablet Take 81 mg by mouth daily.   Yes [provider]  calcium carbonate (OS-CAL - DOSED IN MG OF ELEMENTAL CALCIUM) 1250 (500 Ca) MG tablet Take 2 tablets (1,000 mg of elemental calcium total) by mouth 2 (two) times daily with a meal. 08/29/15  Yes Armandina Gemma, MD  captopril (CAPOTEN) 25 MG tablet Take 25 mg by mouth 3 (three) times daily. 06/04/15  Yes [provider]  Carboxymethylcellulose Sodium (REFRESH TEARS OP) Place 1-2 drops into both eyes as needed (For dry eyes.).   Yes [provider]  fexofenadine (ALLEGRA) 180 MG tablet Take 180 mg by mouth daily.   Yes [provider]  ibuprofen (ADVIL,MOTRIN) 200 MG tablet Take 400 mg by mouth every 6 (six) hours as needed (For pain.).   Yes [provider]  levothyroxine (SYNTHROID) 88 MCG tablet Take 1 tablet (88 mcg total) by mouth daily before breakfast. 08/29/15  Yes Armandina Gemma, MD  ranitidine (ZANTAC) 75 MG tablet Take 75 mg by mouth at bedtime as needed for heartburn.   Yes [provider]  rosuvastatin (CRESTOR) 20 MG tablet Take 20 mg by mouth at bedtime.  06/04/15  Yes [provider]  SYNTHROID 125 MCG tablet  TAKE 1 TABLET BY MOUTH ONCE (1) DAILY AT 6AM 09/13/16  Yes [provider]  Vitamin D, Ergocalciferol, (DRISDOL) 50000 units CAPS capsule Take 50,000 Units by mouth every Wednesday. 06/24/15  Yes [provider]     Allergies:   Codeine; Other; Eggs or egg-derived products; Fish allergy; Niacin and related; Oregano [origanum oil]; and Tetanus toxoids   Social History    Social History  . Marital status: Married    Spouse name: N/A  . Number of children: N/A  . Years of education: N/A   Social History Main Topics  . Smoking status: Never Smoker  . Smokeless tobacco: Never Used  . Alcohol use No  . Drug use: No  . Sexual activity: Not Asked   Other Topics Concern  . None   Social History Narrative  . None     Family History:  The patient's family history is not on file.   Problem Relation Age of Onset  . Aneurysm Mother  born 55  . Heart attack Father 26  . No Known Problems Sister  born 66  . Prostate cancer Brother  . Neuropathy Brother  . Lung cancer Sister  born 88  . COPD Sister  born 60  . High Cholesterol Son  healthy born 61  . Asthma Son  . High Cholesterol Son  Healthy, born 1966  . Asthma Son  Healthy, born 21  . High Cholesterol Daughter  Healthy, born 82    ROS:   Please see the history of present illness.    ROS All other systems reviewed and are negative.   PHYSICAL EXAM:   VS:  BP (!) 144/58 (BP Location: Right Arm, Patient Position: Sitting, Cuff Size: Normal)   Pulse 84   Ht 5\' 6"  (1.676 m)   Wt 159 lb 1.9 oz (72.2 kg)   BMI 25.68 kg/m    GEN: Well nourished, well developed, in no acute distress  HEENT: normal  Neck: no JVD, carotid bruits, or masses Cardiac: RRR; no murmurs, rubs, or gallops,no edema  Respiratory:  clear to auscultation bilaterally, normal work of breathing GI: soft, nontender, nondistended, + BS MS: no deformity or atrophy  Skin: warm and dry, no rash Neuro:  Alert and Oriented x 3, Strength and sensation are intact Psych: euthymic mood, full affect  Wt Readings from Last 3 Encounters:  09/21/16 159 lb 1.9 oz (72.2 kg)  08/28/15 155 lb (70.3 kg)  08/25/15 155 lb (70.3 kg)      Studies/Labs Reviewed:   EKG:  EKG is ordered today.  The ekg ordered today demonstrates NSR  Recent Labs: No results found for requested labs within last 8760 hours.    Reviewed  PCP labs: will scan in system.   Lipid Panel No results found for: CHOL, TRIG, HDL, CHOLHDL, VLDL, LDLCALC, LDLDIRECT  Additional studies/ records that were reviewed today include:  As above  ASSESSMENT & PLAN:    1. Coronary calcification on CTA of chest - Asymptomatic. Continue exercise regimen. Continue aspirin 81mg  Qd and statin.  2. Thyroid cancer status post total thyroidectomy - Followed by Dr. Koleen Nimrod (Endocrinologist) @ [facility].   3. HTN - Elevated today. However, nervous. Has been normal at home. Continue current regimen. No changes.  4. HLD - Continue statin. Followed by PCP. LDL 53.   Discussed with DOD Dr. Lovena Le.   Medication Adjustments/Labs and Tests Ordered: Current medicines are reviewed at length with the patient today.  Concerns regarding medicines are outlined above.  Medication changes, Labs and Tests ordered today are listed in the Patient Instructions below. Patient Instructions  Medication Instructions:  Your physician recommends that you continue on your current medications as directed. Please refer to the Current Medication list given to you today.  Labwork: NONE  Testing/Procedures: NONE  Follow-Up: Your physician wants you to follow-up as needed.   If you need a refill on your cardiac medications before your next appointment, please call your pharmacy.       Jarrett Soho, [place]  09/21/2016 3:30 PM    [facility] [place], [place], [place]  [address] Phone: [phone]; Fax: [phone]

## 2016-09-21 NOTE — Patient Instructions (Signed)
Medication Instructions:  Your physician recommends that you continue on your current medications as directed. Please refer to the Current Medication list given to you today.  Labwork: NONE  Testing/Procedures: NONE  Follow-Up: Your physician wants you to follow-up as needed.  If you need a refill on your cardiac medications before your next appointment, please call your pharmacy.   

## 2016-10-22 DIAGNOSIS — K219 Gastro-esophageal reflux disease without esophagitis: Secondary | ICD-10-CM | POA: Insufficient documentation

## 2018-05-11 ENCOUNTER — Ambulatory Visit (INDEPENDENT_AMBULATORY_CARE_PROVIDER_SITE_OTHER)
Admission: RE | Admit: 2018-05-11 | Discharge: 2018-05-11 | Disposition: A | Discharge status: Home / Self Care | Payer: Medicare Other | Source: Ambulatory Visit | Attending: Internal Medicine | Admitting: Internal Medicine

## 2018-05-11 ENCOUNTER — Ambulatory Visit: Payer: Medicare Other | Admitting: Internal Medicine

## 2018-05-11 ENCOUNTER — Other Ambulatory Visit (HOSPITAL_COMMUNITY)
Admission: RE | Admit: 2018-05-11 | Discharge: 2018-05-11 | Disposition: A | Discharge status: Home / Self Care | Payer: Medicare Other | Source: Other Acute Inpatient Hospital | Attending: Internal Medicine | Admitting: Internal Medicine

## 2018-05-11 ENCOUNTER — Encounter: Payer: Self-pay | Admitting: Internal Medicine

## 2018-05-11 ENCOUNTER — Other Ambulatory Visit: Payer: Self-pay

## 2018-05-11 VITALS — BP 132/84 | HR 119 | Ht 65.5 in | Wt 163.6 lb

## 2018-05-11 DIAGNOSIS — J90 Pleural effusion, not elsewhere classified: Secondary | ICD-10-CM | POA: Diagnosis not present

## 2018-05-11 DIAGNOSIS — R053 Chronic cough: Secondary | ICD-10-CM

## 2018-05-11 DIAGNOSIS — R05 Cough: Secondary | ICD-10-CM | POA: Insufficient documentation

## 2018-05-11 LAB — BODY FLUID CELL COUNT WITH DIFFERENTIAL
Eos, Fluid: 1 %
Lymphs, Fluid: 43 %
Monocyte-Macrophage-Serous Fluid: 46 % — ABNORMAL LOW (ref 50–90)
NEUTROPHIL FLUID: 10 % (ref 0–25)
WBC FLUID: 948 uL (ref 0–1000)

## 2018-05-11 LAB — GLUCOSE, PLEURAL OR PERITONEAL FLUID: Glucose, Fluid: 145 mg/dL

## 2018-05-11 LAB — LACTATE DEHYDROGENASE, PLEURAL OR PERITONEAL FLUID: LD, Fluid: 197 U/L — ABNORMAL HIGH (ref 3–23)

## 2018-05-11 LAB — PROTEIN, PLEURAL OR PERITONEAL FLUID: Total protein, fluid: 4.6 g/dL

## 2018-05-11 MED ORDER — TRAMADOL HCL 50 MG PO TABS: ORAL_TABLET | ORAL | 0 refills | Status: DC

## 2018-05-11 NOTE — Patient Instructions (Signed)
For cough or pain > tramadol 50 mg one every 4 hours as needed  Keep the bandage dry until tomorrow then ok to remove   Please remember to go to the  x-ray department  for your tests - we will call you with the results when they are available    I will call with the lab results to tell you what the next step should be based on the results - call me in meantime if needed

## 2018-05-11 NOTE — Progress Notes (Signed)
Spoke with pt and notified of results per Dr. Wert. Pt verbalized understanding and denied any questions. 

## 2018-05-11 NOTE — Assessment & Plan Note (Addendum)
Onset of symptoms mid Feb 2020  - L thoracentesis 05/11/2018 x 1 liter serosanguinous   Although there are over a hundred causes of pleural effusion,  based on the size and nature of this effusion it is almost certainly malignant > will await results and then decide re pleurex vs VATS pleurodesis per T surgery  ? At [facility] vs here.   Discussed in detail all the  indications, usual  risks and alternatives  relative to the benefits with patient who agrees to proceed with w/u as outlined.

## 2018-05-11 NOTE — Assessment & Plan Note (Signed)
Likely related to effusion though pt's husband says present x months ? Partially acei related   For now rx pain and cough > tramadol 50 mg one q 4 h prn for now    Total time devoted to counseling  > 50 % of initial 40  min office visit:  review case with pt/ discussion of options/alternatives/ personally creating written customized instructions  in presence of pt  then going over those specific  Instructions directly with the pt including how to use all of the meds but in particular covering each new medication in detail and the difference between the maintenance= "automatic" meds and the prns using an action plan format for the latter (If this problem/symptom => do that organization reading Left to right).  Please see AVS from this visit for a full list of these instructions which I personally wrote for this pt and  are unique to this visit.

## 2018-05-11 NOTE — Progress Notes (Signed)
Charlotte Crumb, female    DOB: 1941-02-28, 78 y.o.   MRN: [id number]   Brief patient profile:  54 yowf never smoker onset cough x early 2020 then onset L sided cp p heavy lifting x 04/28/18 worse pain with cough then worse breathing since 05/07/18 rx levaquin 04/18/2018 but returned 05/10/18 due to sob >? Large R effusion referred to pulmonary clinic 05/11/2018 by Dr   Lin Landsman.    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 Pulmonary Consult    referred by Dr. Redding, [place]; CXR 05/10/2018 for lung nodule; no inhalers  Dyspnea:  At rest Cough: dry x months   Last able to lie flat 2 week prior to OV  -  Night prior to ov up all night at > 60 degrees in recliner L side ant cp with cough or deep breath    No obvious day to day or daytime variability or assoc excess/ purulent sputum or mucus plugs or hemoptysis or chest tightness, subjective wheeze or overt sinus or hb symptoms.    Also denies any obvious fluctuation of symptoms with weather or environmental changes or other aggravating or alleviating factors except as outlined above   No unusual exposure hx or h/o childhood pna/ asthma or knowledge of premature birth.  Current Allergies, Complete Past Medical History, Past Surgical History, Family History, and Social History were reviewed in [employer] record.  ROS  The following are not active complaints unless bolded Hoarseness, sore throat, dysphagia, dental problems, itching, sneezing,  nasal congestion or discharge of excess mucus or purulent secretions, ear ache,   fever, chills, sweats, unintended wt loss or wt gain, classically  exertional cp,  orthopnea pnd or arm/hand swelling  or leg swelling, presyncope, palpitations, abdominal pain, anorexia, nausea,  vomiting, diarrhea  or change in bowel habits or change in bladder habits, change in stools or change in urine, dysuria, hematuria,  rash, arthralgias, visual complaints, headache, numbness, weakness or ataxia or problems with walking or coordination,  change in mood or  memory.             Past Medical History:  Diagnosis Date  . Allergic rhinitis    due to pollen  . Cancer ([place])    basal cell removal x 3  . Complication of anesthesia    hx difficult airway/ 2014, 2016-- records on chart  . GERD (gastroesophageal reflux disease)   . Hyperlipidemia    mixed type  . Hypertension   . Osteopenia     Outpatient Medications Prior to Visit  Medication Sig Dispense Refill  . calcium carbonate (OS-CAL - DOSED IN MG OF ELEMENTAL CALCIUM) 1250 (500 Ca) MG tablet Take 2 tablets (1,000 mg of elemental calcium total) by mouth 2 (two) times daily with a meal. 60 tablet 1  . captopril (CAPOTEN) 25 MG tablet Take 25 mg by mouth 3 (three) times daily.  3  . Carboxymethylcellulose Sodium (REFRESH TEARS OP) Place 1-2 drops into both eyes as needed (For dry eyes.).    Marland Kitchen fexofenadine (ALLEGRA) 180 MG tablet Take 180 mg by mouth daily.    Marland Kitchen ibuprofen (ADVIL,MOTRIN) 200 MG tablet Take 400 mg by mouth every 6 (six) hours as needed (For pain.).    Marland Kitchen rosuvastatin (CRESTOR) 20  MG tablet Take 20 mg by mouth at bedtime.   0  . SYNTHROID 125 MCG tablet TAKE 1 TABLET BY MOUTH ONCE (1) DAILY AT 6AM  11  . Vitamin D, Ergocalciferol, (DRISDOL) 50000 units CAPS capsule Take 50,000 Units by mouth every Wednesday.  3  . levothyroxine (SYNTHROID) 88 MCG tablet Take 1 tablet (88 mcg total) by mouth daily before breakfast. 30 tablet 3  . ranitidine (ZANTAC) 75 MG tablet Take 75 mg by mouth at bedtime as needed for heartburn.    Marland Kitchen aspirin EC 81 MG tablet Take 81 mg by mouth daily.        Objective:     BP 132/84 (BP Location: Left Arm, Cuff Size: Normal)   Pulse (!) 119   Ht 5' 5.5" (1.664 m)   Wt 163 lb 9.6 oz  (74.2 kg)   SpO2 96%   BMI 26.81 kg/m   SpO2: 96 % RA  amb pleasant wf nad    HEENT: nl dentition, turbinates bilaterally, and oropharynx. Nl external ear canals without cough reflex   NECK :  without JVD/Nodes/TM/ nl carotid upstrokes bilaterally   LUNGS: no acc muscle use,  Nl contour chest with dullness and decreased BS on L 2/3 up   CV:  RRR  no s3 or murmur or increase in P2, and no edema   ABD:  soft and nontender with nl inspiratory excursion in the supine position. No bruits or organomegaly appreciated, bowel sounds nl  MS:  Nl gait/ ext warm without deformities, calf tenderness, cyanosis or clubbing No obvious joint restrictions   SKIN: warm and dry without lesions    NEURO:  alert, approp, nl sensorium with  no motor or cerebellar deficits apparent.    CXR PA and Lateral:   May 19, 2018 :    I personally reviewed images and agree with radiology impression as follows:   1. Near complete opacification of the left hemithorax with shift of midline to the right. Findings most consistent with a large left pleural process such as pleural fluid collection or empyema. Underlying left lung disease can not be excluded.  2.  Mild interstitial prominence right lung.    Procedure:  Urgent L thoracentesis (sob at rest)  Discussed in detail all the  indications, usual  risks and alternatives  relative to the benefits with patient who agrees to proceed with L thoracentesis urgently under percussion guidance p review of today's xrays.  Sterile prep/ drape.  1% xylocaine used for topical anesthesia. Standard thoracentesis needle used one stick L base yielding 1000 cc serosanguinous fluid. Pt tol well. Fluid sent for the usual studies.   CXR PA and Lateral:   May 19, 2018 :   Post thoracentesis I personally reviewed images and agree with radiology impression as follows:   Large left pleural effusion is slightly smaller compared to prior exam. No pneumothorax is noted.             Assessment   Pleural effusion Onset of symptoms mid Feb 2020  - L thoracentesis May 19, 2018 x 1 liter serosanguinous   Although there are over a hundred causes of pleural effusion,  based on the size and nature of this effusion it is almost certainly malignant > will await results and then decide re pleurex vs VATS pleurodesis per T surgery  ? At [facility] vs here.   Discussed in detail all the  indications, usual  risks and alternatives  relative to the benefits with patient who agrees to proceed with w/u  as outlined.        Chronic cough Likely related to effusion though pt's husband says present x months ? Partially acei related   For now rx pain and cough > tramadol 50 mg one q 4 h prn for now    Total time devoted to counseling  > 50 % of initial 40  min office visit:  review case with pt/ discussion of options/alternatives/ personally creating written customized instructions  in presence of pt  then going over those specific  Instructions directly with the pt including how to use all of the meds but in particular covering each new medication in detail and the difference between the maintenance= "automatic" meds and the prns using an action plan format for the latter (If this problem/symptom => do that organization reading Left to right).  Please see AVS from this visit for a full list of these instructions which I personally wrote for this pt and  are unique to this visit.      Christinia Gully, MD 05/11/2018

## 2018-05-12 LAB — GLUCOSE, BODY FLUID OTHER: GLUCOSE FL: 147 mg/dL

## 2018-05-12 LAB — PROTEIN, BODY FLUID (OTHER): Protein, Fluid: 4.5 g/dL

## 2018-05-16 ENCOUNTER — Telehealth: Payer: Self-pay | Admitting: Internal Medicine

## 2018-05-16 DIAGNOSIS — J90 Pleural effusion, not elsewhere classified: Secondary | ICD-10-CM

## 2018-05-16 NOTE — Telephone Encounter (Signed)
Received call from Dr Saralyn Pilar.  He stated that cytology was very reactive, atypical, showed some mesothelia, but  believed benign.  He stated that he had 2 others look,and they agreed. Dr Saralyn Pilar stated that he should have full report this afternoon. Dr Saralyn Pilar stated that Dr Melvyn Novas could call him with any questions, [phone].   Message routed to Dr Melvyn Novas

## 2018-05-16 NOTE — Telephone Encounter (Signed)
Called and spoke with Haley [name].  Haley [name] requested results from having a thoracentesis from her lung, 05/11/18. Haley [name] also wanted Dr Melvyn Novas to know that she doesn't feel as well as she did.  She stated that she is feeling more tired and experiencing increased SHOB with exertion. She is concerned that she may have more fluid building in her lung.    Message routed to Dr Melvyn Novas

## 2018-05-16 NOTE — Telephone Encounter (Signed)
Called the patient and advised her of Dr. Gustavus Bryant response. Patient confirmed procedure tomorrow. Advised her once he has received those results, he will determine follow up at that time. Patient voiced understanding. Nothing further needed at this time.

## 2018-05-16 NOTE — Telephone Encounter (Signed)
Called and spoke with Patient.  Dr Melvyn Novas recommendations given. Patient stated understanding.  US guided thoracentesis order placed ASAP at [facility].   Labs from 05/11/18, thoracentesis protein, cell count, glucose, lactate, from fluid showing. Called [facility] lab, spoke with [place].  Cytology has been completed, but results not visible. She is calling Dr Saralyn Pilar, at [facility] for results and she stated that she will call me back.

## 2018-05-16 NOTE — Telephone Encounter (Signed)
Let her know study was negative but needs to be repeated at this point (up to 3 times is not unusual and she needs relief anyway so proceed with retap

## 2018-05-16 NOTE — Telephone Encounter (Signed)
Let her know I just checked and it's still pending which is not unusual but   1) call lab and see what's holding it up  2) do u/s guided thoracentesis just for cytology so we have confirmatory study   - asap at [facility]

## 2018-05-17 ENCOUNTER — Ambulatory Visit (HOSPITAL_COMMUNITY)
Admission: RE | Admit: 2018-05-17 | Discharge: 2018-05-17 | Disposition: A | Discharge status: Home / Self Care | Payer: Medicare Other | Source: Ambulatory Visit | Attending: Radiology | Admitting: Radiology

## 2018-05-17 ENCOUNTER — Ambulatory Visit (HOSPITAL_COMMUNITY)
Admission: RE | Admit: 2018-05-17 | Discharge: 2018-05-17 | Disposition: A | Discharge status: Home / Self Care | Payer: Medicare Other | Source: Ambulatory Visit | Attending: Internal Medicine | Admitting: Internal Medicine

## 2018-05-17 DIAGNOSIS — J90 Pleural effusion, not elsewhere classified: Secondary | ICD-10-CM | POA: Diagnosis present

## 2018-05-17 DIAGNOSIS — Z9889 Other specified postprocedural states: Secondary | ICD-10-CM | POA: Diagnosis not present

## 2018-05-17 MED ORDER — LIDOCAINE HCL 1 % IJ SOLN
INTRAMUSCULAR | Status: AC
  Filled 2018-05-17: qty 20

## 2018-05-17 NOTE — Procedures (Addendum)
Ultrasound-guided diagnostic and therapeutic left thoracentesis performed yielding 2.4 liters of hazy, amber fluid. No immediate complications. Follow-up chest x-ray pending. A portion of the fluid was sent to the lab for cytology. EBL < 2cc. Consider f/u CT chest if fluid recurs.

## 2018-05-18 ENCOUNTER — Other Ambulatory Visit: Payer: Self-pay | Admitting: *Deleted

## 2018-05-18 ENCOUNTER — Encounter: Payer: Self-pay | Admitting: *Deleted

## 2018-05-18 NOTE — Progress Notes (Signed)
Oncology Nurse Navigator Documentation  Oncology Nurse Navigator Flowsheets 05/18/2018  Navigator Location [facility]  Navigator Encounter Type Other/patient case was discussed at thoracic cancer conference 05/18/2018.  Recommendation is for further work up and tissue DX.  Pulmonary team will reach out to Dr. Melvyn Novas with an updated.   Barriers/Navigation Needs Coordination of Care  Interventions Coordination of Care  Acuity Level 1  Time Spent with Patient 15

## 2018-05-18 NOTE — Progress Notes (Signed)
The proposed treatment discussed in cancer conference 05/18/2018 is for discussion purpose only and is not a binding recommendation.  The patient was not physically examined nor present for their treatment options.  Therefore, final treatment plans cannot be decided.  

## 2018-05-22 ENCOUNTER — Other Ambulatory Visit: Payer: Self-pay | Admitting: Internal Medicine

## 2018-05-22 DIAGNOSIS — J90 Pleural effusion, not elsewhere classified: Secondary | ICD-10-CM

## 2018-05-22 NOTE — Progress Notes (Signed)
Discussed with pt getting more sob but much better p 2.4 liters removed last week so rec  1) tap dry  2) ct chest p tap dry 3) T surgery evel    Christinia Gully, MD Pulmonary and [place] [phone] After 5:30 PM or weekends, use Beeper [phone]

## 2018-05-23 ENCOUNTER — Ambulatory Visit (HOSPITAL_COMMUNITY)
Admission: RE | Admit: 2018-05-23 | Discharge: 2018-05-23 | Disposition: A | Discharge status: Home / Self Care | Payer: Medicare Other | Source: Ambulatory Visit | Attending: Internal Medicine | Admitting: Internal Medicine

## 2018-05-23 ENCOUNTER — Ambulatory Visit (HOSPITAL_COMMUNITY)
Admission: RE | Admit: 2018-05-23 | Discharge: 2018-05-23 | Disposition: A | Discharge status: Home / Self Care | Payer: Medicare Other | Source: Ambulatory Visit | Attending: Radiology | Admitting: Radiology

## 2018-05-23 ENCOUNTER — Other Ambulatory Visit (INDEPENDENT_AMBULATORY_CARE_PROVIDER_SITE_OTHER): Payer: Medicare Other

## 2018-05-23 DIAGNOSIS — J90 Pleural effusion, not elsewhere classified: Secondary | ICD-10-CM | POA: Insufficient documentation

## 2018-05-23 DIAGNOSIS — R846 Abnormal cytological findings in specimens from respiratory organs and thorax: Secondary | ICD-10-CM | POA: Diagnosis not present

## 2018-05-23 LAB — BASIC METABOLIC PANEL
BUN: 14 mg/dL (ref 6–23)
CALCIUM: 9 mg/dL (ref 8.4–10.5)
CO2: 30 mEq/L (ref 19–32)
Chloride: 99 mEq/L (ref 96–112)
Creatinine, Ser: 0.6 mg/dL (ref 0.40–1.20)
GFR: 97.01 mL/min (ref >60.00)
Glucose, Bld: 119 mg/dL — ABNORMAL HIGH (ref 70–99)
Potassium: 4.2 mEq/L (ref 3.5–5.1)
Sodium: 135 mEq/L (ref 135–145)

## 2018-05-23 MED ORDER — LIDOCAINE HCL 1 % IJ SOLN
INTRAMUSCULAR | Status: AC
  Filled 2018-05-23: qty 10

## 2018-05-23 NOTE — Procedures (Signed)
PROCEDURE SUMMARY:  Successful US guided left thoracentesis. Yielded 1950 mL of clear dark yellow fluid. Pt tolerated procedure well. No immediate complications.  Specimen was sent for labs. CXR ordered.  EBL < 5 mL  Ascencion Dike PA-C 05/23/2018 10:41 AM

## 2018-05-24 ENCOUNTER — Other Ambulatory Visit: Payer: Self-pay

## 2018-05-24 ENCOUNTER — Ambulatory Visit (HOSPITAL_BASED_OUTPATIENT_CLINIC_OR_DEPARTMENT_OTHER)
Admission: RE | Admit: 2018-05-24 | Discharge: 2018-05-24 | Disposition: A | Discharge status: Home / Self Care | Payer: Medicare Other | Source: Ambulatory Visit | Attending: Internal Medicine | Admitting: Internal Medicine

## 2018-05-24 ENCOUNTER — Encounter (HOSPITAL_BASED_OUTPATIENT_CLINIC_OR_DEPARTMENT_OTHER): Payer: Self-pay

## 2018-05-24 DIAGNOSIS — J90 Pleural effusion, not elsewhere classified: Secondary | ICD-10-CM | POA: Diagnosis present

## 2018-05-24 MED ORDER — IOHEXOL 300 MG/ML  SOLN
100.0000 mL | Freq: Once | INTRAMUSCULAR | Status: AC | PRN
  Administered 2018-05-24: 80 mL via INTRAVENOUS

## 2018-05-25 ENCOUNTER — Encounter: Payer: Self-pay | Admitting: Internal Medicine

## 2018-05-25 ENCOUNTER — Telehealth: Payer: Self-pay

## 2018-05-25 NOTE — Telephone Encounter (Signed)
Already discussed with pt by phone - see result note

## 2018-05-25 NOTE — Telephone Encounter (Signed)
Call Report: Levander Campion from [facility] Radiology:    1. Moderate left-sided pleural effusion, with mild anterior loculation. 2. Innumerable bilateral pulmonary nodules, highly suspicious for metastatic disease. Given the clinical history, possibly related to metastatic thyroid carcinoma. 3. Abnormal appearance of the proximal posterior stomach. Although this could represent a gastric diverticulum, a cavitary exophytic lesion such as gastrointestinal stromal tumor could have a similar appearance (especially given adjacent gastrohepatic ligament adenopathy). Correlate with results of thoracentesis. Consider PET and/or endoscopy. 4. Coronary artery atherosclerosis. Aortic Atherosclerosis  MW please advise.

## 2018-05-26 ENCOUNTER — Encounter: Payer: Medicare Other | Admitting: Thoracic Surgery (Cardiothoracic Vascular Surgery)

## 2018-05-26 ENCOUNTER — Other Ambulatory Visit: Payer: Self-pay | Admitting: *Deleted

## 2018-05-26 DIAGNOSIS — J90 Pleural effusion, not elsewhere classified: Secondary | ICD-10-CM

## 2018-05-29 ENCOUNTER — Other Ambulatory Visit: Payer: Self-pay

## 2018-05-29 ENCOUNTER — Other Ambulatory Visit: Payer: Self-pay | Admitting: *Deleted

## 2018-05-29 ENCOUNTER — Institutional Professional Consult (permissible substitution): Payer: Medicare Other | Admitting: Thoracic Surgery (Cardiothoracic Vascular Surgery)

## 2018-05-29 ENCOUNTER — Ambulatory Visit
Admission: RE | Admit: 2018-05-29 | Discharge: 2018-05-29 | Disposition: A | Discharge status: Home / Self Care | Payer: Medicare Other | Source: Ambulatory Visit | Attending: Thoracic Surgery (Cardiothoracic Vascular Surgery) | Admitting: Thoracic Surgery (Cardiothoracic Vascular Surgery)

## 2018-05-29 VITALS — BP 144/88 | HR 108 | Resp 20 | Ht 66.0 in | Wt 153.0 lb

## 2018-05-29 DIAGNOSIS — J90 Pleural effusion, not elsewhere classified: Secondary | ICD-10-CM

## 2018-05-29 NOTE — Progress Notes (Signed)
PCP is Ronita Hipps, MD Referring Provider is Tanda Rockers, MD  Chief Complaint  Patient presents with  . Pleural Effusion    Surgical eval with CXR, Chest CT 05/24/18, HX of  LEFT THORACENTESIS 05/23/18    HPI: Haley [name] is sent for consultation regarding a left pleural effusion.  Haley [name] is a 78 year old woman who has a history of papillary thyroid cancer, basal cell cancer, hypertension, hyperlipidemia, osteopenia, and reflux.  She is in her usual state of health until about a month ago.  She started noticing that she was short of breath with exertion and then started becoming short of breath even at rest.  She had a chest x-ray which showed a large left pleural effusion.  Over the past 3 weeks she has had 3 thoracenteses each draining about a liter and a half of fluid.  Each time she had symptomatic relief but her symptoms recurred within a week.  Cytologies have shown atypical cells but not definitively diagnostic.  A CT of the chest showed a large pleural effusion there were multiple lung nodules bilaterally.  It also suggested the possibility of a gastric tumor.  She denies any chest pain, pressure, or tightness.  She denies change in appetite or weight loss.  She denies any abdominal pain even though she does have a history of reflux in the past.  She does have shortness of breath and orthopnea.  No swelling in her legs.  Zubrod Score: At the time of surgery this patient's most appropriate activity status/level should be described as: []     0    Normal activity, no symptoms [x]     1    Restricted in physical strenuous activity but ambulatory, able to do out light work []     2    Ambulatory and capable of self care, unable to do work activities, up and about >50 % of waking hours                              []     3    Only limited self care, in bed greater than 50% of waking hours []     4    Completely disabled, no self care, confined to bed or chair []     5     Moribund  Past Medical History:  Diagnosis Date  . Allergic rhinitis    due to pollen  . Cancer ([place])    basal cell removal x 3  . Complication of anesthesia    hx difficult airway/ 2014, 2016-- records on chart  . GERD (gastroesophageal reflux disease)   . Hyperlipidemia    mixed type  . Hypertension   . Osteopenia     Past Surgical History:  Procedure Laterality Date  . ABDOMINAL HYSTERECTOMY  1980  . CHOLECYSTECTOMY  11/1984  . INNER EAR SURGERY Bilateral    2014, 2016  . THYROIDECTOMY N/A 08/28/2015   Procedure: TOTAL THYROIDECTOMY WITH LIMITED LYMPH NODE DISSECTION;  Surgeon: Armandina Gemma, MD;  Location: [facility] ORS;  Service: General;  Laterality: N/A;  . TONSILLECTOMY      Family History  Problem Relation Age of Onset  . Aneurysm Mother   . Heart attack Father   . Lung cancer Sister     Social History Social History   Tobacco Use  . Smoking status: Never Smoker  . Smokeless tobacco: Never Used  Substance Use Topics  . Alcohol use: No  .  Drug use: No    Current Outpatient Medications  Medication Sig Dispense Refill  . aspirin EC 81 MG tablet Take 81 mg by mouth daily.    . calcium carbonate (OS-CAL - DOSED IN MG OF ELEMENTAL CALCIUM) 1250 (500 Ca) MG tablet Take 2 tablets (1,000 mg of elemental calcium total) by mouth 2 (two) times daily with a meal. 60 tablet 1  . captopril (CAPOTEN) 25 MG tablet Take 25 mg by mouth 3 (three) times daily.  3  . Carboxymethylcellulose Sodium (REFRESH TEARS OP) Place 1-2 drops into both eyes as needed (For dry eyes.).    Marland Kitchen fexofenadine (ALLEGRA) 180 MG tablet Take 180 mg by mouth daily.    Marland Kitchen ibuprofen (ADVIL,MOTRIN) 200 MG tablet Take 400 mg by mouth every 6 (six) hours as needed (For pain.).    Marland Kitchen rosuvastatin (CRESTOR) 20 MG tablet Take 20 mg by mouth at bedtime.   0  . SYNTHROID 125 MCG tablet TAKE 1 TABLET BY MOUTH ONCE (1) DAILY AT 6AM  11  . traMADol (ULTRAM) 50 MG tablet One every 4 hours as needed for cough or pain 40  tablet 0  . Vitamin D, Ergocalciferol, (DRISDOL) 50000 units CAPS capsule Take 50,000 Units by mouth every Wednesday.  3   No current facility-administered medications for this visit.     Allergies  Allergen Reactions  . Codeine Nausea And Vomiting  . Erythromycin Nausea And Vomiting  . Other Hives, Nausea And Vomiting and Other (See Comments)    Amaretto  . Shellfish Allergy Nausea And Vomiting  . Eggs Or Egg-Derived Products Nausea And Vomiting and Rash  . Fish Allergy Nausea And Vomiting and Rash  . Niacin And Related Rash  . Oregano [Origanum Oil] Nausea And Vomiting and Rash  . Tetanus Toxoids Rash and Other (See Comments)    High fever, body aches - if given she has to receive in 2 shots to prevent reaction    Review of Systems  Constitutional: Negative for activity change, appetite change and unexpected weight change.  HENT: Negative for trouble swallowing and voice change.   Eyes: Negative for visual disturbance.  Respiratory: Positive for shortness of breath.   Cardiovascular: Negative for chest pain and leg swelling.  Gastrointestinal: Negative for abdominal distention, abdominal pain and nausea.  Genitourinary: Negative for difficulty urinating and dysuria.  Musculoskeletal: Negative for arthralgias and myalgias.  Neurological: Negative for seizures, syncope and weakness.  Hematological: Negative for adenopathy. Does not bruise/bleed easily.  All other systems reviewed and are negative.   BP (!) 144/88   Pulse (!) 108   Resp 20   Ht 5\' 6"  (1.676 m)   Wt 153 lb (69.4 kg)   SpO2 98% Comment: RA  BMI 24.69 kg/m  Physical Exam Vitals signs reviewed.  Constitutional:      General: She is not in acute distress.    Appearance: Normal appearance. She is normal weight. She is not ill-appearing or toxic-appearing.  HENT:     Head: Normocephalic and atraumatic.     Mouth/Throat:     Pharynx: Oropharynx is clear.  Eyes:     General: No scleral icterus.     Extraocular Movements: Extraocular movements intact.     Conjunctiva/sclera: Conjunctivae normal.  Neck:     Musculoskeletal: Neck supple.  Cardiovascular:     Rate and Rhythm: Normal rate and regular rhythm.     Pulses: Normal pulses.     Heart sounds: Normal heart sounds. No murmur. No friction  rub. No gallop.   Pulmonary:     Effort: Pulmonary effort is normal. No respiratory distress.     Breath sounds: No wheezing or rales.     Comments: Absent breath sounds left side Abdominal:     General: There is no distension.     Palpations: Abdomen is soft.     Tenderness: There is no abdominal tenderness.  Musculoskeletal:        General: No swelling.  Lymphadenopathy:     Cervical: No cervical adenopathy.  Skin:    General: Skin is warm and dry.  Neurological:     General: No focal deficit present.     Mental Status: She is alert and oriented to person, place, and time.     Cranial Nerves: No cranial nerve deficit.    Diagnostic Tests: CT CHEST WITH CONTRAST  TECHNIQUE: Multidetector CT imaging of the chest was performed during intravenous contrast administration.  CONTRAST:  19mL OMNIPAQUE IOHEXOL 300 MG/ML  SOLN  COMPARISON:  05/23/2018 chest radiograph. Radiographs back to 05/11/2018. No prior CT.  FINDINGS: Cardiovascular: Aortic atherosclerosis. Normal heart size, without pericardial effusion. Multivessel coronary artery atherosclerosis. No central pulmonary embolism, on this non-dedicated study.  Mediastinum/Nodes: Thyroidectomy. No supraclavicular adenopathy. No mediastinal or hilar adenopathy.  Lungs/Pleura: A moderate left-sided pleural effusion, with mild anterior loculation including on image 90/2. No pleural based mass or abnormal pleural enhancement.  Patent airways, including to the left lower lobe.  Innumerable pulmonary nodules are slightly basilar predominant. Example right lower lobe pulmonary nodule at 9 mm on image 115/3.  Index left  upper lobe pulmonary nodule measures 7 mm on image 84/3.  Index right lower lobe pleural-based nodule of 10 mm on image 84/3.  Posterior left upper lobe and posterior left lower lobe pulmonary opacities, favoring atelectasis. No obstructive mass identified.  Upper Abdomen: Normal imaged portions of the liver, spleen, pancreas, adrenal glands, kidneys. Abnormal appearance of the posterior proximal stomach including on image 131/2. outpouching and adjacent underdistention versus wall thickening.  gastrohepatic ligament node measures 1.1 cm on image 145/2.  Musculoskeletal: Osteopenia.  IMPRESSION: 1. Moderate left-sided pleural effusion, with mild anterior loculation. 2. Innumerable bilateral pulmonary nodules, highly suspicious for metastatic disease. Given the clinical history, possibly related to metastatic thyroid carcinoma. 3. Abnormal appearance of the proximal posterior stomach. Although this could represent a gastric diverticulum, a cavitary exophytic lesion such as gastrointestinal stromal tumor could have a similar appearance (especially given adjacent gastrohepatic ligament adenopathy). Correlate with results of thoracentesis. Consider PET and/or endoscopy. 4. Coronary artery atherosclerosis. Aortic Atherosclerosis (ICD10-I70.0).  These results will be called to the ordering clinician or representative by the Radiologist Assistant, and communication documented in the PACS or zVision Dashboard.   Electronically Signed   By: Abigail Miyamoto M.D.   On: 05/24/2018 20:21 I personally reviewed the CT images and concur with the findings noted above  Impression: Haley [name] is a 78 year old woman with a history of papillary thyroid carcinoma, basal cell carcinoma, reflux, hypertension, and hyperlipidemia.  She had a total thyroidectomy in 2017 and about a year later had a lymph node removed that was positive as well.  She has been treated with radioactive iodine.   She now presents with a rapidly recurring pleural effusion.  CT of the chest shows multiple lung nodules.  Cytologies from thoracentesis of shown atypical cells but are nondiagnostic.  Given the high index of suspicion I think the best option for her would be to do a VATS for drainage  of the pleural effusion, pleural biopsies, and wedge resection of 1 of the lung nodules,  We will also need to do something to manage the pleural effusion going forward.  I think the best option if we can get the lung to reexpand would be to do a talc pleurodesis.  If the lung will not completely reexpand and talc will not be effective and I think a pleural catheter would be the backup plan.  I discussed the proposed operation with Haley [name] and her husband.  We reviewed the general nature of the procedure, the need for general anesthesia, the incision to be used, the use of drainage tubes postoperatively, the expected hospital stay, and the overall recovery.  I informed him of the indications, risk, benefits, and alternatives.  They understand the risk include, but not limited to death, MI, DVT, PE, bleeding, possible need for transfusion, infection, air leak, recurrent effusion, as well as the possibility of other unforeseeable complications.  She accepts the risks and wishes to proceed.  She needs symptomatic relief prior to our scheduled surgery next week.  We will arrange for an ultrasound-guided thoracentesis to drain some fluid.  Question of a gastric stromal tumor on CT of the chest-we will refer to GI to see if endoscopy is indicated.  Plan: Gastroenterology consultation regarding possible gastric tumor Ultrasound-guided thoracentesis Left VATS for drainage of pleural effusion, wedge resection of lung nodule, entire pleurodesis or pleural catheter placement on Wednesday, 06/08/2018.  Melrose Nakayama, MD [facility] [phone]

## 2018-05-29 NOTE — H&P (View-Only) (Signed)
PCP is Ronita Hipps, MD Referring Provider is Tanda Rockers, MD  Chief Complaint  Patient presents with  . Pleural Effusion    Surgical eval with CXR, Chest CT 05/24/18, HX of  LEFT THORACENTESIS 05/23/18    HPI: [name] is sent for consultation regarding a left pleural effusion.  Calysta [name] is a 78 year old woman who has a history of papillary thyroid cancer, basal cell cancer, hypertension, hyperlipidemia, osteopenia, and reflux.  She is in her usual state of health until about a month ago.  She started noticing that she was short of breath with exertion and then started becoming short of breath even at rest.  She had a chest x-ray which showed a large left pleural effusion.  Over the past 3 weeks she has had 3 thoracenteses each draining about a liter and a half of fluid.  Each time she had symptomatic relief but her symptoms recurred within a week.  Cytologies have shown atypical cells but not definitively diagnostic.  A CT of the chest showed a large pleural effusion there were multiple lung nodules bilaterally.  It also suggested the possibility of a gastric tumor.  She denies any chest pain, pressure, or tightness.  She denies change in appetite or weight loss.  She denies any abdominal pain even though she does have a history of reflux in the past.  She does have shortness of breath and orthopnea.  No swelling in her legs.  Zubrod Score: At the time of surgery this patient's most appropriate activity status/level should be described as: []     0    Normal activity, no symptoms [x]     1    Restricted in physical strenuous activity but ambulatory, able to do out light work []     2    Ambulatory and capable of self care, unable to do work activities, up and about >50 % of waking hours                              []     3    Only limited self care, in bed greater than 50% of waking hours []     4    Completely disabled, no self care, confined to bed or chair []     5     Moribund  Past Medical History:  Diagnosis Date  . Allergic rhinitis    due to pollen  . Cancer ([place])    basal cell removal x 3  . Complication of anesthesia    hx difficult airway/ 2014, 2016-- records on chart  . GERD (gastroesophageal reflux disease)   . Hyperlipidemia    mixed type  . Hypertension   . Osteopenia     Past Surgical History:  Procedure Laterality Date  . ABDOMINAL HYSTERECTOMY  1980  . CHOLECYSTECTOMY  11/1984  . INNER EAR SURGERY Bilateral    2014, 2016  . THYROIDECTOMY N/A 08/28/2015   Procedure: TOTAL THYROIDECTOMY WITH LIMITED LYMPH NODE DISSECTION;  Surgeon: Armandina Gemma, MD;  Location: [facility];  Service: General;  Laterality: N/A;  . TONSILLECTOMY      Family History  Problem Relation Age of Onset  . Aneurysm Mother   . Heart attack Father   . Lung cancer Sister     Social History Social History   Tobacco Use  . Smoking status: Never Smoker  . Smokeless tobacco: Never Used  Substance Use Topics  . Alcohol use: No  .  Drug use: No    Current Outpatient Medications  Medication Sig Dispense Refill  . aspirin EC 81 MG tablet Take 81 mg by mouth daily.    . calcium carbonate (OS-CAL - DOSED IN MG OF ELEMENTAL CALCIUM) 1250 (500 Ca) MG tablet Take 2 tablets (1,000 mg of elemental calcium total) by mouth 2 (two) times daily with a meal. 60 tablet 1  . captopril (CAPOTEN) 25 MG tablet Take 25 mg by mouth 3 (three) times daily.  3  . Carboxymethylcellulose Sodium (REFRESH TEARS OP) Place 1-2 drops into both eyes as needed (For dry eyes.).    Marland Kitchen fexofenadine (ALLEGRA) 180 MG tablet Take 180 mg by mouth daily.    Marland Kitchen ibuprofen (ADVIL,MOTRIN) 200 MG tablet Take 400 mg by mouth every 6 (six) hours as needed (For pain.).    Marland Kitchen rosuvastatin (CRESTOR) 20 MG tablet Take 20 mg by mouth at bedtime.   0  . SYNTHROID 125 MCG tablet TAKE 1 TABLET BY MOUTH ONCE (1) DAILY AT 6AM  11  . traMADol (ULTRAM) 50 MG tablet One every 4 hours as needed for cough or pain 40  tablet 0  . Vitamin D, Ergocalciferol, (DRISDOL) 50000 units CAPS capsule Take 50,000 Units by mouth every Wednesday.  3   No current facility-administered medications for this visit.     Allergies  Allergen Reactions  . Codeine Nausea And Vomiting  . Erythromycin Nausea And Vomiting  . Other Hives, Nausea And Vomiting and Other (See Comments)    Amaretto  . Shellfish Allergy Nausea And Vomiting  . Eggs Or Egg-Derived Products Nausea And Vomiting and Rash  . Fish Allergy Nausea And Vomiting and Rash  . Niacin And Related Rash  . Oregano [Origanum Oil] Nausea And Vomiting and Rash  . Tetanus Toxoids Rash and Other (See Comments)    High fever, body aches - if given she has to receive in 2 shots to prevent reaction    Review of Systems  Constitutional: Negative for activity change, appetite change and unexpected weight change.  HENT: Negative for trouble swallowing and voice change.   Eyes: Negative for visual disturbance.  Respiratory: Positive for shortness of breath.   Cardiovascular: Negative for chest pain and leg swelling.  Gastrointestinal: Negative for abdominal distention, abdominal pain and nausea.  Genitourinary: Negative for difficulty urinating and dysuria.  Musculoskeletal: Negative for arthralgias and myalgias.  Neurological: Negative for seizures, syncope and weakness.  Hematological: Negative for adenopathy. Does not bruise/bleed easily.  All other systems reviewed and are negative.   BP (!) 144/88   Pulse (!) 108   Resp 20   Ht 5\' 6"  (1.676 m)   Wt 153 lb (69.4 kg)   SpO2 98% Comment: RA  BMI 24.69 kg/m  Physical Exam Vitals signs reviewed.  Constitutional:      General: She is not in acute distress.    Appearance: Normal appearance. She is normal weight. She is not ill-appearing or toxic-appearing.  HENT:     Head: Normocephalic and atraumatic.     Mouth/Throat:     Pharynx: Oropharynx is clear.  Eyes:     General: No scleral icterus.     Extraocular Movements: Extraocular movements intact.     Conjunctiva/sclera: Conjunctivae normal.  Neck:     Musculoskeletal: Neck supple.  Cardiovascular:     Rate and Rhythm: Normal rate and regular rhythm.     Pulses: Normal pulses.     Heart sounds: Normal heart sounds. No murmur. No friction  rub. No gallop.   Pulmonary:     Effort: Pulmonary effort is normal. No respiratory distress.     Breath sounds: No wheezing or rales.     Comments: Absent breath sounds left side Abdominal:     General: There is no distension.     Palpations: Abdomen is soft.     Tenderness: There is no abdominal tenderness.  Musculoskeletal:        General: No swelling.  Lymphadenopathy:     Cervical: No cervical adenopathy.  Skin:    General: Skin is warm and dry.  Neurological:     General: No focal deficit present.     Mental Status: She is alert and oriented to person, place, and time.     Cranial Nerves: No cranial nerve deficit.    Diagnostic Tests: CT CHEST WITH CONTRAST  TECHNIQUE: Multidetector CT imaging of the chest was performed during intravenous contrast administration.  CONTRAST:  43mL OMNIPAQUE IOHEXOL 300 MG/ML  SOLN  COMPARISON:  05/23/2018 chest radiograph. Radiographs back to 05/11/2018. No prior CT.  FINDINGS: Cardiovascular: Aortic atherosclerosis. Normal heart size, without pericardial effusion. Multivessel coronary artery atherosclerosis. No central pulmonary embolism, on this non-dedicated study.  Mediastinum/Nodes: Thyroidectomy. No supraclavicular adenopathy. No mediastinal or hilar adenopathy.  Lungs/Pleura: A moderate left-sided pleural effusion, with mild anterior loculation including on image 90/2. No pleural based mass or abnormal pleural enhancement.  Patent airways, including to the left lower lobe.  Innumerable pulmonary nodules are slightly basilar predominant. Example right lower lobe pulmonary nodule at 9 mm on image 115/3.  Index left  upper lobe pulmonary nodule measures 7 mm on image 84/3.  Index right lower lobe pleural-based nodule of 10 mm on image 84/3.  Posterior left upper lobe and posterior left lower lobe pulmonary opacities, favoring atelectasis. No obstructive mass identified.  Upper Abdomen: Normal imaged portions of the liver, spleen, pancreas, adrenal glands, kidneys. Abnormal appearance of the posterior proximal stomach including on image 131/2. outpouching and adjacent underdistention versus wall thickening.  gastrohepatic ligament node measures 1.1 cm on image 145/2.  Musculoskeletal: Osteopenia.  IMPRESSION: 1. Moderate left-sided pleural effusion, with mild anterior loculation. 2. Innumerable bilateral pulmonary nodules, highly suspicious for metastatic disease. Given the clinical history, possibly related to metastatic thyroid carcinoma. 3. Abnormal appearance of the proximal posterior stomach. Although this could represent a gastric diverticulum, a cavitary exophytic lesion such as gastrointestinal stromal tumor could have a similar appearance (especially given adjacent gastrohepatic ligament adenopathy). Correlate with results of thoracentesis. Consider PET and/or endoscopy. 4. Coronary artery atherosclerosis. Aortic Atherosclerosis (ICD10-I70.0).  These results will be called to the ordering clinician or representative by the Radiologist Assistant, and communication documented in the PACS or zVision Dashboard.   Electronically Signed   By: Abigail Miyamoto M.D.   On: 05/24/2018 20:21 I personally reviewed the CT images and concur with the findings noted above  Impression: Haley [name] is a 78 year old woman with a history of papillary thyroid carcinoma, basal cell carcinoma, reflux, hypertension, and hyperlipidemia.  She had a total thyroidectomy in 2017 and about a year later had a lymph node removed that was positive as well.  She has been treated with radioactive iodine.   She now presents with a rapidly recurring pleural effusion.  CT of the chest shows multiple lung nodules.  Cytologies from thoracentesis of shown atypical cells but are nondiagnostic.  Given the high index of suspicion I think the best option for her would be to do a VATS for drainage  of the pleural effusion, pleural biopsies, and wedge resection of 1 of the lung nodules,  We will also need to do something to manage the pleural effusion going forward.  I think the best option if we can get the lung to reexpand would be to do a talc pleurodesis.  If the lung will not completely reexpand and talc will not be effective and I think a pleural catheter would be the backup plan.  I discussed the proposed operation with Mrs. Lasala and her husband.  We reviewed the general nature of the procedure, the need for general anesthesia, the incision to be used, the use of drainage tubes postoperatively, the expected hospital stay, and the overall recovery.  I informed him of the indications, risk, benefits, and alternatives.  They understand the risk include, but not limited to death, MI, DVT, PE, bleeding, possible need for transfusion, infection, air leak, recurrent effusion, as well as the possibility of other unforeseeable complications.  She accepts the risks and wishes to proceed.  She needs symptomatic relief prior to our scheduled surgery next week.  We will arrange for an ultrasound-guided thoracentesis to drain some fluid.  Question of a gastric stromal tumor on CT of the chest-we will refer to GI to see if endoscopy is indicated.  Plan: Gastroenterology consultation regarding possible gastric tumor Ultrasound-guided thoracentesis Left VATS for drainage of pleural effusion, wedge resection of lung nodule, entire pleurodesis or pleural catheter placement on Wednesday, 06/08/2018.  Melrose Nakayama, MD [facility] [phone]

## 2018-05-30 ENCOUNTER — Other Ambulatory Visit: Payer: Self-pay

## 2018-05-30 DIAGNOSIS — J90 Pleural effusion, not elsewhere classified: Secondary | ICD-10-CM

## 2018-05-31 ENCOUNTER — Ambulatory Visit (HOSPITAL_COMMUNITY)
Admission: RE | Admit: 2018-05-31 | Discharge: 2018-05-31 | Disposition: A | Discharge status: Home / Self Care | Payer: Medicare Other | Source: Ambulatory Visit | Attending: Thoracic Surgery (Cardiothoracic Vascular Surgery) | Admitting: Thoracic Surgery (Cardiothoracic Vascular Surgery)

## 2018-05-31 ENCOUNTER — Other Ambulatory Visit: Payer: Self-pay

## 2018-05-31 ENCOUNTER — Ambulatory Visit (HOSPITAL_COMMUNITY)
Admission: RE | Admit: 2018-05-31 | Discharge: 2018-05-31 | Disposition: A | Discharge status: Home / Self Care | Payer: Medicare Other | Source: Ambulatory Visit | Attending: Radiology | Admitting: Radiology

## 2018-05-31 DIAGNOSIS — Z9889 Other specified postprocedural states: Secondary | ICD-10-CM | POA: Diagnosis not present

## 2018-05-31 DIAGNOSIS — J90 Pleural effusion, not elsewhere classified: Secondary | ICD-10-CM | POA: Diagnosis present

## 2018-05-31 DIAGNOSIS — R0600 Dyspnea, unspecified: Secondary | ICD-10-CM | POA: Insufficient documentation

## 2018-05-31 MED ORDER — LIDOCAINE HCL 1 % IJ SOLN
INTRAMUSCULAR | Status: AC
  Filled 2018-05-31: qty 10

## 2018-05-31 NOTE — Procedures (Signed)
Ultrasound-guided  therapeutic left thoracentesis performed yielding 2.1 liters of hazy,amber fluid. No immediate complications. Follow-up chest x-ray pending. EBL< 1 cc.

## 2018-06-02 NOTE — Pre-Procedure Instructions (Signed)
Haley [name]  06/02/2018      [address], [place] [address] Phone: [phone] Fax: [phone]    Your procedure is scheduled on Wednesday, March 25th.  Report to [facility] Entrance "A" Admitting at 5:30 A.M.  Call this number if you have problems the morning of surgery:  [phone]   Remember:  Do not eat or drink after midnight.    Take these medicines the morning of surgery with A SIP OF WATER  fexofenadine (ALLEGRA)  SYNTHROID    As needed: traMADol (ULTRAM) Eye drops  As of today, STOP taking any Aspirin (unless otherwise instructed by your surgeon), Aleve, Naproxen, Ibuprofen, Motrin, Advil, Goody's, BC's, all herbal medications, fish oil, and all vitamins.   Do not wear jewelry, make-up or nail polish.  Do not wear lotions, powders, or perfumes, or deodorant.  Do not shave 48 hours prior to surgery.    Do not bring valuables to the hospital.  [facility] is not responsible for any belongings or valuables.  Contacts, dentures or bridgework may not be worn into surgery.  Leave your suitcase in the car.  After surgery it may be brought to your room.  For patients admitted to the hospital, discharge time will be determined by your treatment team.  Patients discharged the day of surgery will not be allowed to drive home.   Special instructions:   [facility]- Preparing For Surgery  Before surgery, you can play an important role. Because skin is not sterile, your skin needs to be as free of germs as possible. You can reduce the number of germs on your skin by washing with CHG (chlorahexidine gluconate) Soap before surgery.  CHG is an antiseptic cleaner which kills germs and bonds with the skin to continue killing germs even after washing.    Oral Hygiene is also important to reduce your risk of infection.  Remember - BRUSH YOUR TEETH THE MORNING OF SURGERY WITH YOUR REGULAR TOOTHPASTE  Please  do not use if you have an allergy to CHG or antibacterial soaps. If your skin becomes reddened/irritated stop using the CHG.  Do not shave (including legs and underarms) for at least 48 hours prior to first CHG shower. It is OK to shave your face.  Please follow these instructions carefully.   1. Shower the NIGHT BEFORE SURGERY and the MORNING OF SURGERY with CHG.   2. If you chose to wash your hair, wash your hair first as usual with your normal shampoo.  3. After you shampoo, rinse your hair and body thoroughly to remove the shampoo.  4. Use CHG as you would any other liquid soap. You can apply CHG directly to the skin and wash gently with a scrungie or a clean washcloth.   5. Apply the CHG Soap to your body ONLY FROM THE NECK DOWN.  Do not use on open wounds or open sores. Avoid contact with your eyes, ears, mouth and genitals (private parts). Wash Face and genitals (private parts)  with your normal soap.  6. Wash thoroughly, paying special attention to the area where your surgery will be performed.  7. Thoroughly rinse your body with warm water from the neck down.  8. DO NOT shower/wash with your normal soap after using and rinsing off the CHG Soap.  9. Pat yourself dry with a CLEAN TOWEL.  10. Wear CLEAN PAJAMAS to bed the night before  surgery, wear comfortable clothes the morning of surgery  11. Place CLEAN SHEETS on your bed the night of your first shower and DO NOT SLEEP WITH PETS.    Day of Surgery:  Do not apply any deodorants/lotions.  Please wear clean clothes to the hospital/surgery center.   Remember to brush your teeth WITH YOUR REGULAR TOOTHPASTE.  Please read over the following fact sheets that you were given.

## 2018-06-05 ENCOUNTER — Ambulatory Visit (HOSPITAL_COMMUNITY)
Admission: RE | Admit: 2018-06-05 | Discharge: 2018-06-05 | Disposition: A | Discharge status: Home / Self Care | Payer: Medicare Other | Source: Ambulatory Visit | Attending: Thoracic Surgery (Cardiothoracic Vascular Surgery) | Admitting: Thoracic Surgery (Cardiothoracic Vascular Surgery)

## 2018-06-05 ENCOUNTER — Encounter (HOSPITAL_COMMUNITY): Payer: Self-pay

## 2018-06-05 ENCOUNTER — Other Ambulatory Visit: Payer: Self-pay

## 2018-06-05 ENCOUNTER — Encounter (HOSPITAL_COMMUNITY)
Admission: RE | Admit: 2018-06-05 | Discharge: 2018-06-05 | Disposition: A | Discharge status: Home / Self Care | Payer: Medicare Other | Source: Ambulatory Visit | Attending: Thoracic Surgery (Cardiothoracic Vascular Surgery) | Admitting: Thoracic Surgery (Cardiothoracic Vascular Surgery)

## 2018-06-05 DIAGNOSIS — J90 Pleural effusion, not elsewhere classified: Secondary | ICD-10-CM

## 2018-06-05 HISTORY — DX: Personal history of malignant neoplasm of thyroid: Z85.850

## 2018-06-05 HISTORY — DX: Failed or difficult intubation, initial encounter: T88.4XXA

## 2018-06-05 HISTORY — DX: Dyspnea, unspecified: R06.00

## 2018-06-05 LAB — URINALYSIS, ROUTINE W REFLEX MICROSCOPIC
Bilirubin Urine: NEGATIVE
Glucose, UA: NEGATIVE mg/dL
Ketones, ur: NEGATIVE mg/dL
Leukocytes,Ua: NEGATIVE
Nitrite: NEGATIVE
Protein, ur: 30 mg/dL — AB
Specific Gravity, Urine: 1.016 (ref 1.005–1.030)
pH: 6 (ref 5.0–8.0)

## 2018-06-05 LAB — TYPE AND SCREEN
ABO/RH(D): A POS
Antibody Screen: NEGATIVE

## 2018-06-05 LAB — COMPREHENSIVE METABOLIC PANEL
ALT: 16 U/L (ref 0–44)
AST: 15 U/L (ref 15–41)
Albumin: 3.3 g/dL — ABNORMAL LOW (ref 3.5–5.0)
Alkaline Phosphatase: 65 U/L (ref 38–126)
Anion gap: 13 (ref 5–15)
BUN: 13 mg/dL (ref 8–23)
CHLORIDE: 102 mmol/L (ref 98–111)
CO2: 22 mmol/L (ref 22–32)
Calcium: 9.1 mg/dL (ref 8.9–10.3)
Creatinine, Ser: 0.63 mg/dL (ref 0.44–1.00)
GFR calc Af Amer: >60 mL/min (ref >60)
GFR calc non Af Amer: >60 mL/min (ref >60)
Glucose, Bld: 144 mg/dL — ABNORMAL HIGH (ref 70–99)
POTASSIUM: 3.9 mmol/L (ref 3.5–5.1)
Sodium: 137 mmol/L (ref 135–145)
Total Bilirubin: 0.7 mg/dL (ref 0.3–1.2)
Total Protein: 6.5 g/dL (ref 6.5–8.1)

## 2018-06-05 LAB — SURGICAL PCR SCREEN
MRSA, PCR: NEGATIVE
Staphylococcus aureus: NEGATIVE

## 2018-06-05 LAB — CBC
HCT: 49 % — ABNORMAL HIGH (ref 36.0–46.0)
Hemoglobin: 14.7 g/dL (ref 12.0–15.0)
MCH: 27 pg (ref 26.0–34.0)
MCHC: 30 g/dL (ref 30.0–36.0)
MCV: 90.1 fL (ref 80.0–100.0)
Platelets: 297 10*3/uL (ref 150–400)
RBC: 5.44 MIL/uL — ABNORMAL HIGH (ref 3.87–5.11)
RDW: 14.9 % (ref 11.5–15.5)
WBC: 13.1 10*3/uL — ABNORMAL HIGH (ref 4.0–10.5)
nRBC: 0 % (ref 0.0–0.2)

## 2018-06-05 LAB — PROTIME-INR
INR: 1.1 (ref 0.8–1.2)
Prothrombin Time: 13.8 seconds (ref 11.4–15.2)

## 2018-06-05 LAB — APTT: APTT: 24 s (ref 24–36)

## 2018-06-05 LAB — ABO/RH: ABO/RH(D): A POS

## 2018-06-05 NOTE — Progress Notes (Addendum)
Anesthesia Chart Review:  Case:  [id number] Date/Time:  06/07/18 0715   Procedures:      VIDEO ASSISTED THORACOSCOPY (VATS)/WEDGE RESECTION (Left Chest)     DRAINAGE OF PLEURAL EFFUSION, possible Talc Pleurodesis (Left )     INSERTION PLEURAL DRAINAGE CATHETER (Left )   Anesthesia type:  General   Pre-op diagnosis:  Left Pleural Effusion   Location:  [facility] OR ROOM 10 / [place] [facility]   Surgeon:  Melrose Nakayama, MD      DISCUSSION: Patient is a 78 year old female scheduled for the above procedure. Patient developed worsening dyspnea in 04/2018 and was found to have a left pleural effusion and has had to undergo several thoracenteses. Imaging also revealed numerous pulmonary nodules suspicious for metastatic disease. Pleural fluid cytology has shown atypical cells that are also suspicious for malignancy. The above procedures recommended for pleural effusion management and for definitive tissue diagnosis.  History includes never smoker, HTN, GERD, HLD, skin cancer ([facility]), Hashimoto's hypothyroidism, papillary thyroid cancer (s/p thyroidectomy 08/28/15, positive margins, 3 of 4 positive LN; s/p radioactive iodine 131-I treatment 11/26/15; s/p modified radical bilateral neck dissection 12/02/16 and RAI 01/19/17), recurrent left pleural effusion (last thoracentesis 05/31/18), DIFFICULT AIRWAY (02/13/13).  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   Regarding DIFFICULT AIRWAY history: 12/02/16: Modified radical neck dissection, [facility] Endotracheal tube type: ETT  Successful intubation technique: fiberoptic Fiberoptic scope size: 5.2 Endotracheal tube insertion site: oral ETT size: 6.0 mm (cuffed) View (Cormack Lehane grade): grade I - visualization of entire laryngeal aperture Number of attempts at approach: 1 Airway placement trauma: none Additional Comments: Smooth IV induction. Very easy mask. Fiberscope due  to history of difficult intubation. Airway noted to be narrow on approach. Fiberscope visualized passing through cords. 6.0 MLT ETT inserted easily through vocal cords, secured at 23cm @lips   08/28/15: thyroidectomy, [facility] Intubation Type: IV induction Ventilation: Mask ventilation without difficulty Laryngoscope Size: Glidescope, Miller, 2 and 4 (Attempted with Miller 2; changed to #4 glidescope) Grade View: Grade I Tube type: Oral Tube size: 7.0 mm Number of attempts: 2 Airway Equipment and Method: Patient positioned with wedge pillow, Stylet and Video-laryngoscopy Difficulty Due To: Difficulty was anticipated Comments: Attempted intubation with Miller 2 blade; switched to #4 Glidescope. No trauma or bleeding noted. Patient stable throughout induction and intubation.  03/25/14: right tympanomastoidectomy, [facility]* Intubation with Glidescope #4  02/13/13: mastoidectomy, [facility]* Mask vent without OA easy throughout.  Poor view of DL with #3 Glidescope, ____ length. DL with #4 Glidescope with grade 2, ETT unable to pass initially with head _____ and thyroid pressure. Successful intubation using Glidescope blade #4 for depth needed with regular stylette with "hockey stick" bend.   Difficulty due to: "anterior larynx" and reduced mouth opening Recommendation for future anesthetics: Induction with short acting agent and alternative techniques readily available.  *[facility] records are scanned under Media tab, Correspondence, Encounter 08/28/15.   Dr. Roxan Hockey classified her Zubrod Score as 1: Restricted in physical strenuous activity but ambulatory, able to do out light work.  Patient with persistent large left pleural effusion, increased since 05/31/18. She was mildly tachycardic at PAT, but no tachypnea or hypoxia by vitals. PAT RN reported patient without acute respiratory symptoms during her PAT interview. I called CXR report to [facility]  RN Ryan.   Based on currently available information I would anticipate that she  can proceed as planned. She needs an ABG on the day of surgery.   VS: BP (!) 151/69   Pulse (!) 111 Comment: notified Debbie RN  Temp 36.6 C   Resp 20   Ht 5\' 6"  (1.676 m)   Wt 69.2 kg   SpO2 100%   BMI 24.63 kg/m  HR 101 bpm on EKG.      PROVIDERS: Ronita Hipps, MD is PCP Christinia Gully, MD is pulmonologist Wyvonne Lenz, MD is endocrinologist ([place])   LABS: Labs reviewed: Acceptable for surgery. (all labs ordered are listed, but only abnormal results are displayed)  Labs Reviewed  CBC - Abnormal; Notable for the following components:      Result Value   WBC 13.1 (*)    RBC 5.44 (*)    HCT 49.0 (*)    All other components within normal limits  COMPREHENSIVE METABOLIC PANEL - Abnormal; Notable for the following components:   Glucose, Bld 144 (*)    Albumin 3.3 (*)    All other components within normal limits  URINALYSIS, ROUTINE W REFLEX MICROSCOPIC - Abnormal; Notable for the following components:   Hgb urine dipstick SMALL (*)    Protein, ur 30 (*)    Bacteria, UA RARE (*)    All other components within normal limits  SURGICAL PCR SCREEN  APTT  PROTIME-INR  BLOOD GAS, ARTERIAL  TYPE AND SCREEN  ABO/RH     IMAGES: CXR 06/05/18: IMPRESSION: Large LEFT pleural effusion with significant LEFT lung atelectasis, increased since 05/31/2018. Multiple pulmonary nodules. Subsegmental atelectasis RIGHT lower lobe.  CT chest 05/24/18: IMPRESSION: 1. Moderate left-sided pleural effusion, with mild anterior loculation. 2. Innumerable bilateral pulmonary nodules, highly suspicious for metastatic disease. Given the clinical history, possibly related to metastatic thyroid carcinoma. 3. Abnormal appearance of the proximal posterior stomach. Although this could represent a gastric diverticulum, a cavitary exophytic lesion such as gastrointestinal stromal tumor could  have a similar appearance (especially given adjacent gastrohepatic ligament adenopathy). Correlate with results of thoracentesis. Consider PET and/or endoscopy. 4. Coronary artery atherosclerosis. Aortic Atherosclerosis (ICD10-I70.0).   EKG: ST at 101 BPM   CV: N/A   Past Medical History:  Diagnosis Date  . Allergic rhinitis    due to pollen  . Cancer ([place])    basal cell removal x 3  . Complication of anesthesia    hx difficult airway/ 2014, 2016  . Difficult intubation    02/16/11  . Dyspnea    with recent fluid build up  started 4 weeks ago today 06/05/2018  . GERD (gastroesophageal reflux disease)   . History of thyroid cancer    removed 2017  . Hyperlipidemia    mixed type  . Hypertension   . Osteopenia     Past Surgical History:  Procedure Laterality Date  . ABDOMINAL HYSTERECTOMY  1980  . CHOLECYSTECTOMY  11/1984  . INNER EAR SURGERY Bilateral    2014, 2016  . THYROIDECTOMY N/A 08/28/2015   Procedure: TOTAL THYROIDECTOMY WITH LIMITED LYMPH NODE DISSECTION;  Surgeon: Armandina Gemma, MD;  Location: [facility];  Service: General;  Laterality: N/A;  . TONSILLECTOMY      MEDICATIONS: . cimetidine (TAGAMET) 200 MG tablet  . calcium carbonate (OS-CAL - DOSED IN MG OF ELEMENTAL CALCIUM) 1250 (500 Ca) MG tablet  . captopril (CAPOTEN) 25 MG tablet  . Carboxymethylcellulose Sodium (REFRESH TEARS OP)  . fexofenadine (ALLEGRA) 180 MG tablet  . ibuprofen (ADVIL,MOTRIN) 200 MG tablet  . rosuvastatin (CRESTOR) 20  MG tablet  . SYNTHROID 125 MCG tablet  . traMADol (ULTRAM) 50 MG tablet  . Vitamin D, Ergocalciferol, (DRISDOL) 50000 units CAPS capsule   No current facility-administered medications for this encounter.     Myra Gianotti, PA-C Surgical Short Stay/Anesthesiology [facility] Phone [phone] [facility] Phone [phone] 06/05/2018 3:55 PM

## 2018-06-05 NOTE — Pre-Procedure Instructions (Signed)
Haley [name]  06/05/2018      [address] [place] [address] Phone: [phone] Fax: [phone]    Your procedure is scheduled on Wednesday, March 25th.   Report to [facility] Entrance "A" Admitting at 5:30 A.M.             (posted surgery time 7:30a - 11:30a)   Call this number if you have problems the morning of surgery:  [phone]   Remember:  Do not eat any foods or drink any liquids after midnight, Tuesday.    Take these medicines the morning of surgery with A SIP OF WATER  fexofenadine (ALLEGRA)  SYNTHROID    As needed: traMADol (ULTRAM) Eye drops  As of today, STOP taking any Aspirin (unless otherwise instructed by your surgeon), Aleve, Naproxen, Ibuprofen, Motrin, Advil, Goody's, BC's, all herbal medications, fish oil, and all vitamins.   Do not wear jewelry, make-up or nail polish.  Do not wear lotions, powders, perfumes, or deodorant.  Do not shave 48 hours prior to surgery.    Do not bring valuables to the hospital.  [facility] is not responsible for any belongings or valuables.  Contacts, dentures or bridgework may not be worn into surgery.  Leave your suitcase in the car.  After surgery it may be brought to your room.  For patients admitted to the hospital, discharge time will be determined by your treatment team.     Special instructions:   [facility]- Preparing For Surgery  Before surgery, you can play an important role. Because skin is not sterile, your skin needs to be as free of germs as possible. You can reduce the number of germs on your skin by washing with CHG (chlorahexidine gluconate) Soap before surgery.  CHG is an antiseptic cleaner which kills germs and bonds with the skin to continue killing germs even after washing.    Oral Hygiene is also important to reduce your risk of infection.   Remember - BRUSH YOUR TEETH THE MORNING OF SURGERY WITH YOUR REGULAR  TOOTHPASTE  Please do not use if you have an allergy to CHG or antibacterial soaps. If your skin becomes reddened/irritated stop using the CHG.  Do not shave (including legs and underarms) for at least 48 hours prior to first CHG shower. It is OK to shave your face.  Please follow these instructions carefully.   1. Shower the NIGHT BEFORE SURGERY and the MORNING OF SURGERY with CHG.   2. If you chose to wash your hair, wash your hair first as usual with your normal shampoo.  3. After you shampoo, rinse your hair and body thoroughly to remove the shampoo.  4. Use CHG as you would any other liquid soap. You can apply CHG directly to the skin and wash gently with a scrungie or a clean washcloth.   5. Apply the CHG Soap to your body ONLY FROM THE NECK DOWN.  Do not use on open wounds or open sores. Avoid contact with your eyes, ears, mouth and genitals (private parts). Wash Face and genitals (private parts)  with your normal soap.  6. Wash thoroughly, paying special attention to the area where your surgery will be performed.  7. Thoroughly rinse your body with warm water from the neck down.  8. DO NOT shower/wash with your normal soap after using and rinsing off the CHG Soap.  9. Pat yourself dry with  a CLEAN TOWEL.  10. Wear CLEAN PAJAMAS to bed the night before surgery, wear comfortable clothes the morning of surgery  11. Place CLEAN SHEETS on your bed the night of your first shower and DO NOT SLEEP WITH PETS.  Day of Surgery:  Do not apply any deodorants/lotions.  Please wear clean clothes to the hospital/surgery center.   Remember to brush your teeth WITH YOUR REGULAR TOOTHPASTE.  Please read over the following fact sheets that you were given.

## 2018-06-05 NOTE — Progress Notes (Signed)
PCP - Dr. Nadyne Coombes 04/2018 Cardiologist -   Chest x-ray - 06/05/2018 EKG - 06/05/2018  Stress Test - denies ECHO - denies Cardiac Cath - denies  Sleep Study - denies CPAP -   Fasting Blood Sugar - denies Checks Blood Sugar _____ times a day  Blood Thinner Instructions:  Per PAT instructions Aspirin Instructions:  Anesthesia review:   Patient denies shortness of breath, fever, cough and chest pain at PAT appointment   Patient verbalized understanding of instructions that were given to them at the PAT appointment. Patient was also instructed that they will need to review over the PAT instructions again at home before surgery.

## 2018-06-05 NOTE — Anesthesia Preprocedure Evaluation (Addendum)
Anesthesia Evaluation  Patient identified by MRN, date of birth, ID band Patient awake    Reviewed: Allergy & Precautions, NPO status , Patient's Chart, lab work & pertinent test results  History of Anesthesia Complications (+) DIFFICULT AIRWAY and history of anesthetic complications  Airway Mallampati: IV  TM Distance: >3 FB Neck ROM: Full    Dental no notable dental hx.    Pulmonary  Left Pleural Effusion   Pulmonary exam normal breath sounds clear to auscultation       Cardiovascular hypertension, Pt. 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  Reproductive/Obstetrics                           Anesthesia Physical Anesthesia Plan  ASA: III  Anesthesia Plan: General   Post-op Pain Management:    Induction: Intravenous, Rapid sequence and Cricoid pressure planned  PONV Risk Score and Plan: 3 and Ondansetron, Dexamethasone and Treatment may vary due to age or medical condition  Airway Management Planned: Video Laryngoscope Planned and Double  Lumen EBT  Additional Equipment: Arterial line, CVP and Ultrasound Guidance Line Placement  Intra-op Plan:   Post-operative Plan: Extubation in OR and Possible Post-op intubation/ventilation  Informed Consent: I have reviewed the patients History and Physical, chart, labs and discussed the procedure including the risks, benefits and alternatives for the proposed anesthesia with the patient or authorized representative who has indicated his/her understanding and acceptance.     Dental advisory given  Plan Discussed with: CRNA and Surgeon  Anesthesia Plan Comments: (History of difficult intubation. Oral fiberoptic intubation 11/2016, Glidescope 08/2015. See PAT note written 06/05/2018 by Myra Gianotti, PA-C.  )     Anesthesia Quick Evaluation

## 2018-06-07 ENCOUNTER — Encounter (HOSPITAL_COMMUNITY): Payer: Self-pay

## 2018-06-07 ENCOUNTER — Encounter (HOSPITAL_COMMUNITY)
Admission: RE | Disposition: A | Discharge status: Home Health | Payer: Self-pay | Source: Home / Self Care | Attending: Thoracic Surgery (Cardiothoracic Vascular Surgery)

## 2018-06-07 ENCOUNTER — Other Ambulatory Visit: Payer: Self-pay

## 2018-06-07 ENCOUNTER — Inpatient Hospital Stay (HOSPITAL_COMMUNITY)
Admission: RE | Admit: 2018-06-07 | Discharge: 2018-06-12 | DRG: 168 | Disposition: A | Discharge status: Home Health | Payer: Medicare Other | Attending: Thoracic Surgery (Cardiothoracic Vascular Surgery) | Admitting: Thoracic Surgery (Cardiothoracic Vascular Surgery)

## 2018-06-07 ENCOUNTER — Inpatient Hospital Stay (HOSPITAL_COMMUNITY): Payer: Medicare Other | Admitting: Physician Assistant

## 2018-06-07 ENCOUNTER — Inpatient Hospital Stay (HOSPITAL_COMMUNITY): Payer: Medicare Other | Admitting: Anesthesiology

## 2018-06-07 ENCOUNTER — Inpatient Hospital Stay (HOSPITAL_COMMUNITY): Payer: Medicare Other

## 2018-06-07 DIAGNOSIS — Z801 Family history of malignant neoplasm of trachea, bronchus and lung: Secondary | ICD-10-CM

## 2018-06-07 DIAGNOSIS — Z79890 Hormone replacement therapy (postmenopausal): Secondary | ICD-10-CM

## 2018-06-07 DIAGNOSIS — Z9049 Acquired absence of other specified parts of digestive tract: Secondary | ICD-10-CM | POA: Diagnosis not present

## 2018-06-07 DIAGNOSIS — Z90710 Acquired absence of both cervix and uterus: Secondary | ICD-10-CM

## 2018-06-07 DIAGNOSIS — Z85850 Personal history of malignant neoplasm of thyroid: Secondary | ICD-10-CM

## 2018-06-07 DIAGNOSIS — Z79891 Long term (current) use of opiate analgesic: Secondary | ICD-10-CM

## 2018-06-07 DIAGNOSIS — M8580 Other specified disorders of bone density and structure, unspecified site: Secondary | ICD-10-CM | POA: Diagnosis present

## 2018-06-07 DIAGNOSIS — Z91012 Allergy to eggs: Secondary | ICD-10-CM

## 2018-06-07 DIAGNOSIS — K219 Gastro-esophageal reflux disease without esophagitis: Secondary | ICD-10-CM | POA: Diagnosis present

## 2018-06-07 DIAGNOSIS — Z9689 Presence of other specified functional implants: Secondary | ICD-10-CM

## 2018-06-07 DIAGNOSIS — Z7982 Long term (current) use of aspirin: Secondary | ICD-10-CM

## 2018-06-07 DIAGNOSIS — J90 Pleural effusion, not elsewhere classified: Secondary | ICD-10-CM

## 2018-06-07 DIAGNOSIS — Z91018 Allergy to other foods: Secondary | ICD-10-CM

## 2018-06-07 DIAGNOSIS — Z8249 Family history of ischemic heart disease and other diseases of the circulatory system: Secondary | ICD-10-CM

## 2018-06-07 DIAGNOSIS — Z4682 Encounter for fitting and adjustment of non-vascular catheter: Secondary | ICD-10-CM

## 2018-06-07 DIAGNOSIS — I10 Essential (primary) hypertension: Secondary | ICD-10-CM | POA: Diagnosis present

## 2018-06-07 DIAGNOSIS — Z885 Allergy status to narcotic agent status: Secondary | ICD-10-CM | POA: Diagnosis not present

## 2018-06-07 DIAGNOSIS — E785 Hyperlipidemia, unspecified: Secondary | ICD-10-CM | POA: Diagnosis present

## 2018-06-07 DIAGNOSIS — J910 Malignant pleural effusion: Secondary | ICD-10-CM

## 2018-06-07 DIAGNOSIS — E890 Postprocedural hypothyroidism: Secondary | ICD-10-CM | POA: Diagnosis present

## 2018-06-07 DIAGNOSIS — Z79899 Other long term (current) drug therapy: Secondary | ICD-10-CM

## 2018-06-07 DIAGNOSIS — Z85828 Personal history of other malignant neoplasm of skin: Secondary | ICD-10-CM

## 2018-06-07 DIAGNOSIS — Z888 Allergy status to other drugs, medicaments and biological substances status: Secondary | ICD-10-CM

## 2018-06-07 DIAGNOSIS — Z91013 Allergy to seafood: Secondary | ICD-10-CM

## 2018-06-07 DIAGNOSIS — Z09 Encounter for follow-up examination after completed treatment for conditions other than malignant neoplasm: Secondary | ICD-10-CM

## 2018-06-07 HISTORY — PX: CHEST TUBE INSERTION: SHX231

## 2018-06-07 HISTORY — PX: PLEURAL EFFUSION DRAINAGE: SHX5099

## 2018-06-07 HISTORY — PX: VIDEO ASSISTED THORACOSCOPY (VATS)/WEDGE RESECTION: SHX6174

## 2018-06-07 SURGERY — VIDEO ASSISTED THORACOSCOPY (VATS)/WEDGE RESECTION
Anesthesia: General | Site: Chest | Laterality: Left

## 2018-06-07 MED ORDER — MORPHINE SULFATE 2 MG/ML IV SOLN
INTRAVENOUS | Status: DC
  Administered 2018-06-07: 7 mg via INTRAVENOUS
  Administered 2018-06-07: 0 mg via INTRAVENOUS
  Administered 2018-06-07: 12:00:00 via INTRAVENOUS
  Administered 2018-06-07: 0 mg via INTRAVENOUS
  Administered 2018-06-08: 3 mg via INTRAVENOUS
  Administered 2018-06-08: 0 mg via INTRAVENOUS
  Administered 2018-06-08: 2 mg via INTRAVENOUS
  Administered 2018-06-09: 0 mg via INTRAVENOUS
  Administered 2018-06-09: 2 mg via INTRAVENOUS
  Administered 2018-06-09 (×2): 0 mg via INTRAVENOUS
  Administered 2018-06-09 (×2): 2 mL via INTRAVENOUS
  Administered 2018-06-10 (×2): 0 mg via INTRAVENOUS
  Filled 2018-06-07: qty 50

## 2018-06-07 MED ORDER — PHENYLEPHRINE 40 MCG/ML (10ML) SYRINGE FOR IV PUSH (FOR BLOOD PRESSURE SUPPORT)
PREFILLED_SYRINGE | INTRAVENOUS | Status: AC
  Filled 2018-06-07: qty 10

## 2018-06-07 MED ORDER — ACETAMINOPHEN 500 MG PO TABS: 1000.0000 mg | ORAL_TABLET | Freq: Once | ORAL | Status: DC | PRN

## 2018-06-07 MED ORDER — FENTANYL CITRATE (PF) 100 MCG/2ML IJ SOLN
INTRAMUSCULAR | Status: DC | PRN
  Administered 2018-06-07: 50 ug via INTRAVENOUS
  Administered 2018-06-07 (×4): 100 ug via INTRAVENOUS

## 2018-06-07 MED ORDER — LIDOCAINE HCL (CARDIAC) PF 100 MG/5ML IV SOSY
PREFILLED_SYRINGE | INTRAVENOUS | Status: DC | PRN
  Administered 2018-06-07: 60 mg via INTRAVENOUS

## 2018-06-07 MED ORDER — LEVOTHYROXINE SODIUM 125 MCG PO TABS
125.0000 ug | ORAL_TABLET | Freq: Every day | ORAL | Status: DC
  Administered 2018-06-07 – 2018-06-11 (×5): 125 ug via ORAL
  Filled 2018-06-07 (×5): qty 1

## 2018-06-07 MED ORDER — FENTANYL CITRATE (PF) 250 MCG/5ML IJ SOLN
INTRAMUSCULAR | Status: AC
  Filled 2018-06-07: qty 5

## 2018-06-07 MED ORDER — ACETAMINOPHEN 160 MG/5ML PO SOLN
1000.0000 mg | Freq: Four times a day (QID) | ORAL | Status: DC
  Administered 2018-06-11: 1000 mg via ORAL
  Filled 2018-06-07: qty 40.6

## 2018-06-07 MED ORDER — ACETAMINOPHEN 500 MG PO TABS
1000.0000 mg | ORAL_TABLET | Freq: Four times a day (QID) | ORAL | Status: DC
  Administered 2018-06-07 – 2018-06-11 (×16): 1000 mg via ORAL
  Filled 2018-06-07 (×17): qty 2

## 2018-06-07 MED ORDER — TRAMADOL HCL 50 MG PO TABS
50.0000 mg | ORAL_TABLET | Freq: Four times a day (QID) | ORAL | Status: DC | PRN
  Administered 2018-06-07: 50 mg via ORAL
  Administered 2018-06-08 – 2018-06-12 (×12): 100 mg via ORAL
  Filled 2018-06-07 (×11): qty 2
  Filled 2018-06-07: qty 1
  Filled 2018-06-07: qty 2

## 2018-06-07 MED ORDER — ROSUVASTATIN CALCIUM 20 MG PO TABS
20.0000 mg | ORAL_TABLET | Freq: Every day | ORAL | Status: DC
  Administered 2018-06-07 – 2018-06-11 (×5): 20 mg via ORAL
  Filled 2018-06-07 (×5): qty 1

## 2018-06-07 MED ORDER — OXYCODONE HCL 5 MG/5ML PO SOLN: 5.0000 mg | Freq: Once | ORAL | Status: DC | PRN

## 2018-06-07 MED ORDER — PHENYLEPHRINE HCL 10 MG/ML IJ SOLN
INTRAMUSCULAR | Status: DC | PRN
  Administered 2018-06-07 (×5): 80 ug via INTRAVENOUS

## 2018-06-07 MED ORDER — DIPHENHYDRAMINE HCL 12.5 MG/5ML PO ELIX
12.5000 mg | ORAL_SOLUTION | Freq: Four times a day (QID) | ORAL | Status: DC | PRN
  Filled 2018-06-07: qty 5

## 2018-06-07 MED ORDER — FENTANYL CITRATE (PF) 100 MCG/2ML IJ SOLN: 25.0000 ug | INTRAMUSCULAR | Status: DC | PRN

## 2018-06-07 MED ORDER — ENOXAPARIN SODIUM 40 MG/0.4ML ~~LOC~~ SOLN
40.0000 mg | SUBCUTANEOUS | Status: DC
  Administered 2018-06-07 – 2018-06-10 (×4): 40 mg via SUBCUTANEOUS
  Filled 2018-06-07 (×4): qty 0.4

## 2018-06-07 MED ORDER — METOCLOPRAMIDE HCL 5 MG/ML IJ SOLN
10.0000 mg | Freq: Four times a day (QID) | INTRAMUSCULAR | Status: DC
  Administered 2018-06-07 – 2018-06-12 (×19): 10 mg via INTRAVENOUS
  Filled 2018-06-07 (×19): qty 2

## 2018-06-07 MED ORDER — PROPOFOL 10 MG/ML IV BOLUS
INTRAVENOUS | Status: AC
  Filled 2018-06-07: qty 20

## 2018-06-07 MED ORDER — FAMOTIDINE 20 MG PO TABS
20.0000 mg | ORAL_TABLET | Freq: Every day | ORAL | Status: DC
  Administered 2018-06-08 – 2018-06-12 (×5): 20 mg via ORAL
  Filled 2018-06-07 (×5): qty 1

## 2018-06-07 MED ORDER — TALC (STERITALC) POWDER FOR INTRAPLEURAL USE
INTRAPLEURAL | Status: DC | PRN
  Administered 2018-06-07: 4 g via INTRAPLEURAL

## 2018-06-07 MED ORDER — LACTATED RINGERS IV SOLN
INTRAVENOUS | Status: DC | PRN
  Administered 2018-06-07 (×2): via INTRAVENOUS

## 2018-06-07 MED ORDER — 0.9 % SODIUM CHLORIDE (POUR BTL) OPTIME
TOPICAL | Status: DC | PRN
  Administered 2018-06-07: 3000 mL

## 2018-06-07 MED ORDER — LORATADINE 10 MG PO TABS
10.0000 mg | ORAL_TABLET | Freq: Every day | ORAL | Status: DC
  Administered 2018-06-08 – 2018-06-12 (×5): 10 mg via ORAL
  Filled 2018-06-07 (×5): qty 1

## 2018-06-07 MED ORDER — SUCCINYLCHOLINE CHLORIDE 200 MG/10ML IV SOSY
PREFILLED_SYRINGE | INTRAVENOUS | Status: AC
  Filled 2018-06-07: qty 10

## 2018-06-07 MED ORDER — ACETAMINOPHEN 10 MG/ML IV SOLN: 1000.0000 mg | Freq: Once | INTRAVENOUS | Status: DC | PRN

## 2018-06-07 MED ORDER — ROCURONIUM BROMIDE 100 MG/10ML IV SOLN
INTRAVENOUS | Status: DC | PRN
  Administered 2018-06-07: 50 mg via INTRAVENOUS
  Administered 2018-06-07: 30 mg via INTRAVENOUS

## 2018-06-07 MED ORDER — ONDANSETRON HCL 4 MG/2ML IJ SOLN: 4.0000 mg | Freq: Four times a day (QID) | INTRAMUSCULAR | Status: DC | PRN

## 2018-06-07 MED ORDER — SODIUM CHLORIDE 0.9% FLUSH
9.0000 mL | INTRAVENOUS | Status: DC | PRN
  Administered 2018-06-08: 9 mL via INTRAVENOUS
  Filled 2018-06-07: qty 9

## 2018-06-07 MED ORDER — LIDOCAINE 2% (20 MG/ML) 5 ML SYRINGE
INTRAMUSCULAR | Status: AC
  Filled 2018-06-07: qty 5

## 2018-06-07 MED ORDER — CEFAZOLIN SODIUM-DEXTROSE 2-4 GM/100ML-% IV SOLN
2.0000 g | Freq: Three times a day (TID) | INTRAVENOUS | Status: AC
  Administered 2018-06-07 (×2): 2 g via INTRAVENOUS
  Filled 2018-06-07 (×2): qty 100

## 2018-06-07 MED ORDER — SUGAMMADEX SODIUM 200 MG/2ML IV SOLN
INTRAVENOUS | Status: DC | PRN
  Administered 2018-06-07: 300 mg via INTRAVENOUS

## 2018-06-07 MED ORDER — SODIUM CHLORIDE 0.9 % IV SOLN
INTRAVENOUS | Status: DC | PRN
  Administered 2018-06-07: 40 ug/min via INTRAVENOUS

## 2018-06-07 MED ORDER — SUCCINYLCHOLINE CHLORIDE 20 MG/ML IJ SOLN
INTRAMUSCULAR | Status: DC | PRN
  Administered 2018-06-07: 70 mg via INTRAVENOUS

## 2018-06-07 MED ORDER — NALOXONE HCL 0.4 MG/ML IJ SOLN: 0.4000 mg | INTRAMUSCULAR | Status: DC | PRN

## 2018-06-07 MED ORDER — POLYVINYL ALCOHOL 1.4 % OP SOLN
1.0000 [drp] | OPHTHALMIC | Status: DC | PRN
  Filled 2018-06-07: qty 15

## 2018-06-07 MED ORDER — TALC (STERITALC) POWDER FOR INTRAPLEURAL USE
INTRAPLEURAL | Status: AC
  Filled 2018-06-07: qty 4

## 2018-06-07 MED ORDER — BUPIVACAINE HCL (PF) 0.5 % IJ SOLN
INTRAMUSCULAR | Status: AC
  Filled 2018-06-07: qty 30

## 2018-06-07 MED ORDER — CAPTOPRIL 25 MG PO TABS
25.0000 mg | ORAL_TABLET | Freq: Three times a day (TID) | ORAL | Status: DC
  Administered 2018-06-08 – 2018-06-11 (×12): 25 mg via ORAL
  Filled 2018-06-07 (×13): qty 1

## 2018-06-07 MED ORDER — ACETAMINOPHEN 160 MG/5ML PO SOLN: 1000.0000 mg | Freq: Once | ORAL | Status: DC | PRN

## 2018-06-07 MED ORDER — BUPIVACAINE LIPOSOME 1.3 % IJ SUSP
20.0000 mL | INTRAMUSCULAR | Status: DC
  Filled 2018-06-07: qty 20

## 2018-06-07 MED ORDER — ESMOLOL HCL 100 MG/10ML IV SOLN
INTRAVENOUS | Status: DC | PRN
  Administered 2018-06-07: 30 mg via INTRAVENOUS

## 2018-06-07 MED ORDER — BUPIVACAINE LIPOSOME 1.3 % IJ SUSP
INTRAMUSCULAR | Status: DC | PRN
  Administered 2018-06-07: 43.5 mL

## 2018-06-07 MED ORDER — ONDANSETRON HCL 4 MG/2ML IJ SOLN
INTRAMUSCULAR | Status: DC | PRN
  Administered 2018-06-07: 4 mg via INTRAVENOUS

## 2018-06-07 MED ORDER — SENNOSIDES-DOCUSATE SODIUM 8.6-50 MG PO TABS
1.0000 | ORAL_TABLET | Freq: Every day | ORAL | Status: DC
  Administered 2018-06-07 – 2018-06-09 (×3): 1 via ORAL
  Filled 2018-06-07 (×4): qty 1

## 2018-06-07 MED ORDER — BISACODYL 5 MG PO TBEC
10.0000 mg | DELAYED_RELEASE_TABLET | Freq: Every day | ORAL | Status: DC
  Administered 2018-06-07 – 2018-06-11 (×4): 10 mg via ORAL
  Filled 2018-06-07 (×5): qty 2

## 2018-06-07 MED ORDER — SODIUM CHLORIDE (PF) 0.9 % IJ SOLN
INTRAMUSCULAR | Status: DC | PRN
  Administered 2018-06-07: 43.5 mL

## 2018-06-07 MED ORDER — CEFAZOLIN SODIUM-DEXTROSE 2-4 GM/100ML-% IV SOLN
INTRAVENOUS | Status: AC
  Filled 2018-06-07: qty 100

## 2018-06-07 MED ORDER — DEXAMETHASONE SODIUM PHOSPHATE 10 MG/ML IJ SOLN
INTRAMUSCULAR | Status: DC | PRN
  Administered 2018-06-07: 10 mg via INTRAVENOUS

## 2018-06-07 MED ORDER — ONDANSETRON HCL 4 MG/2ML IJ SOLN
INTRAMUSCULAR | Status: AC
  Filled 2018-06-07: qty 2

## 2018-06-07 MED ORDER — CARBOXYMETHYLCELLULOSE SODIUM 0.5 % OP SOLN: 1.0000 [drp] | Freq: Every day | OPHTHALMIC | Status: DC | PRN

## 2018-06-07 MED ORDER — POTASSIUM CHLORIDE 10 MEQ/50ML IV SOLN: 10.0000 meq | Freq: Every day | INTRAVENOUS | Status: DC | PRN

## 2018-06-07 MED ORDER — CEFAZOLIN SODIUM-DEXTROSE 2-4 GM/100ML-% IV SOLN
2.0000 g | INTRAVENOUS | Status: AC
  Administered 2018-06-07: 2 g via INTRAVENOUS

## 2018-06-07 MED ORDER — DIPHENHYDRAMINE HCL 50 MG/ML IJ SOLN: 12.5000 mg | Freq: Four times a day (QID) | INTRAMUSCULAR | Status: DC | PRN

## 2018-06-07 MED ORDER — MIDAZOLAM HCL 5 MG/5ML IJ SOLN
INTRAMUSCULAR | Status: DC | PRN
  Administered 2018-06-07: 2 mg via INTRAVENOUS

## 2018-06-07 MED ORDER — ROCURONIUM BROMIDE 50 MG/5ML IV SOSY
PREFILLED_SYRINGE | INTRAVENOUS | Status: AC
  Filled 2018-06-07: qty 10

## 2018-06-07 MED ORDER — DEXAMETHASONE SODIUM PHOSPHATE 10 MG/ML IJ SOLN
INTRAMUSCULAR | Status: AC
  Filled 2018-06-07: qty 1

## 2018-06-07 MED ORDER — PROPOFOL 10 MG/ML IV BOLUS
INTRAVENOUS | Status: DC | PRN
  Administered 2018-06-07: 120 mg via INTRAVENOUS
  Administered 2018-06-07: 30 mg via INTRAVENOUS

## 2018-06-07 MED ORDER — DEXTROSE-NACL 5-0.9 % IV SOLN
INTRAVENOUS | Status: DC
  Administered 2018-06-07 – 2018-06-10 (×3): via INTRAVENOUS

## 2018-06-07 MED ORDER — MIDAZOLAM HCL 2 MG/2ML IJ SOLN
INTRAMUSCULAR | Status: AC
  Filled 2018-06-07: qty 2

## 2018-06-07 MED ORDER — CALCIUM CARBONATE 1250 (500 CA) MG PO TABS: 2.0000 | ORAL_TABLET | Freq: Two times a day (BID) | ORAL | Status: DC

## 2018-06-07 MED ORDER — ROCURONIUM BROMIDE 100 MG/10ML IV SOLN: INTRAVENOUS | Status: DC | PRN

## 2018-06-07 MED ORDER — OXYCODONE HCL 5 MG PO TABS: 5.0000 mg | ORAL_TABLET | Freq: Once | ORAL | Status: DC | PRN

## 2018-06-07 SURGICAL SUPPLY — 91 items
CANISTER SUCT 3000ML PPV (MISCELLANEOUS) ×8 IMPLANT
CATH ROBINSON RED A/P 18FR (CATHETERS) ×4 IMPLANT
CATH THORACIC 28FR (CATHETERS) IMPLANT
CATH THORACIC 36FR (CATHETERS) IMPLANT
CATH THORACIC 36FR RT ANG (CATHETERS) IMPLANT
CLIP VESOCCLUDE MED 6/CT (CLIP) ×8 IMPLANT
CONN ST 1/4X3/8  BEN (MISCELLANEOUS) ×2
CONN ST 1/4X3/8 BEN (MISCELLANEOUS) ×2 IMPLANT
CONN Y 3/8X3/8X3/8  BEN (MISCELLANEOUS)
CONN Y 3/8X3/8X3/8 BEN (MISCELLANEOUS) IMPLANT
CONT SPEC 4OZ CLIKSEAL STRL BL (MISCELLANEOUS) ×16 IMPLANT
COVER SURGICAL LIGHT HANDLE (MISCELLANEOUS) ×4 IMPLANT
COVER WAND RF STERILE (DRAPES) IMPLANT
DERMABOND ADVANCED (GAUZE/BANDAGES/DRESSINGS) ×2
DERMABOND ADVANCED .7 DNX12 (GAUZE/BANDAGES/DRESSINGS) ×2 IMPLANT
DRAIN CHANNEL 28F RND 3/8 FF (WOUND CARE) IMPLANT
DRAIN CHANNEL 32F RND 10.7 FF (WOUND CARE) IMPLANT
DRAPE C-ARM 42X72 X-RAY (DRAPES) IMPLANT
DRAPE CV SPLIT W-CLR ANES SCRN (DRAPES) ×4 IMPLANT
DRAPE LAPAROSCOPIC ABDOMINAL (DRAPES) IMPLANT
DRAPE ORTHO SPLIT 77X108 STRL (DRAPES) ×4
DRAPE SURG ORHT 6 SPLT 77X108 (DRAPES) ×4 IMPLANT
DRAPE WARM FLUID 44X44 (DRAPE) IMPLANT
ELECT BLADE 6.5 EXT (BLADE) ×4 IMPLANT
ELECT CAUTERY BLADE 6.4 (BLADE) ×4 IMPLANT
ELECT REM PT RETURN 9FT ADLT (ELECTROSURGICAL) ×4
ELECTRODE REM PT RTRN 9FT ADLT (ELECTROSURGICAL) ×2 IMPLANT
GAUZE SPONGE 4X4 12PLY STRL (GAUZE/BANDAGES/DRESSINGS) IMPLANT
GAUZE SPONGE 4X4 12PLY STRL LF (GAUZE/BANDAGES/DRESSINGS) ×4 IMPLANT
GLOVE SURG SIGNA 7.5 PF LTX (GLOVE) ×8 IMPLANT
GOWN STRL REUS W/ TWL LRG LVL3 (GOWN DISPOSABLE) ×4 IMPLANT
GOWN STRL REUS W/ TWL XL LVL3 (GOWN DISPOSABLE) ×4 IMPLANT
GOWN STRL REUS W/TWL LRG LVL3 (GOWN DISPOSABLE) ×4
GOWN STRL REUS W/TWL XL LVL3 (GOWN DISPOSABLE) ×4
HEMOSTAT SURGICEL 2X14 (HEMOSTASIS) IMPLANT
KIT BASIN OR (CUSTOM PROCEDURE TRAY) ×4 IMPLANT
KIT PLEURX DRAIN CATH 1000ML (MISCELLANEOUS) IMPLANT
KIT PLEURX DRAIN CATH 15.5FR (DRAIN) ×4 IMPLANT
KIT SUCTION CATH 14FR (SUCTIONS) IMPLANT
KIT TURNOVER KIT B (KITS) ×4 IMPLANT
NEEDLE HYPO 25GX1X1/2 BEV (NEEDLE) ×4 IMPLANT
NEEDLE SPNL 18GX3.5 QUINCKE PK (NEEDLE) ×4 IMPLANT
NS IRRIG 1000ML POUR BTL (IV SOLUTION) ×12 IMPLANT
PACK CHEST (CUSTOM PROCEDURE TRAY) ×4 IMPLANT
PACK GENERAL/GYN (CUSTOM PROCEDURE TRAY) ×4 IMPLANT
PAD ARMBOARD 7.5X6 YLW CONV (MISCELLANEOUS) ×8 IMPLANT
POUCH ENDO CATCH II 15MM (MISCELLANEOUS) IMPLANT
POUCH SPECIMEN RETRIEVAL 10MM (ENDOMECHANICALS) IMPLANT
SCISSORS ENDO CVD 5DCS (MISCELLANEOUS) IMPLANT
SEALANT PROGEL (MISCELLANEOUS) IMPLANT
SEALANT SURG COSEAL 4ML (VASCULAR PRODUCTS) IMPLANT
SEALANT SURG COSEAL 8ML (VASCULAR PRODUCTS) IMPLANT
SET DRAINAGE LINE (MISCELLANEOUS) IMPLANT
SHEARS HARMONIC HDI 20CM (ELECTROSURGICAL) IMPLANT
SOLUTION ANTI FOG 6CC (MISCELLANEOUS) ×4 IMPLANT
SPECIMEN JAR MEDIUM (MISCELLANEOUS) ×4 IMPLANT
SPONGE INTESTINAL PEANUT (DISPOSABLE) ×4 IMPLANT
SPONGE TONSIL TAPE 1 RFD (DISPOSABLE) ×8 IMPLANT
SUT ETHILON 3 0 FSL (SUTURE) IMPLANT
SUT PROLENE 4 0 RB 1 (SUTURE) ×2
SUT PROLENE 4-0 RB1 .5 CRCL 36 (SUTURE) ×2 IMPLANT
SUT SILK  1 MH (SUTURE) ×2
SUT SILK 1 MH (SUTURE) ×2 IMPLANT
SUT SILK 1 TIES 10X30 (SUTURE) IMPLANT
SUT SILK 2 0 SH (SUTURE) IMPLANT
SUT SILK 2 0SH CR/8 30 (SUTURE) IMPLANT
SUT SILK 3 0 SH 30 (SUTURE) ×4 IMPLANT
SUT SILK 3 0SH CR/8 30 (SUTURE) ×4 IMPLANT
SUT VIC AB 0 CTX 27 (SUTURE) IMPLANT
SUT VIC AB 1 CTX 27 (SUTURE) ×8 IMPLANT
SUT VIC AB 2-0 CT1 27 (SUTURE)
SUT VIC AB 2-0 CT1 TAPERPNT 27 (SUTURE) IMPLANT
SUT VIC AB 2-0 CTX 36 (SUTURE) ×8 IMPLANT
SUT VIC AB 3-0 MH 27 (SUTURE) IMPLANT
SUT VIC AB 3-0 SH 27 (SUTURE)
SUT VIC AB 3-0 SH 27X BRD (SUTURE) IMPLANT
SUT VIC AB 3-0 X1 27 (SUTURE) ×4 IMPLANT
SUT VICRYL 0 UR6 27IN ABS (SUTURE) IMPLANT
SUT VICRYL 2 TP 1 (SUTURE) IMPLANT
SYR 30ML LL (SYRINGE) ×8 IMPLANT
SYR CONTROL 10ML LL (SYRINGE) ×4 IMPLANT
SYSTEM SAHARA CHEST DRAIN ATS (WOUND CARE) ×4 IMPLANT
TAPE CLOTH SURG 6X10 WHT LF (GAUZE/BANDAGES/DRESSINGS) ×4 IMPLANT
TIP APPLICATOR SPRAY EXTEND 16 (VASCULAR PRODUCTS) IMPLANT
TOWEL GREEN STERILE (TOWEL DISPOSABLE) ×4 IMPLANT
TOWEL GREEN STERILE FF (TOWEL DISPOSABLE) ×4 IMPLANT
TRAY FOLEY MTR SLVR 16FR STAT (SET/KITS/TRAYS/PACK) ×4 IMPLANT
TROCAR XCEL BLADELESS 5X75MML (TROCAR) ×4 IMPLANT
TROCAR XCEL NON-BLD 5MMX100MML (ENDOMECHANICALS) IMPLANT
VALVE REPLACEMENT CAP (MISCELLANEOUS) IMPLANT
WATER STERILE IRR 1000ML POUR (IV SOLUTION) ×8 IMPLANT

## 2018-06-07 NOTE — Plan of Care (Signed)

## 2018-06-07 NOTE — Interval H&P Note (Signed)
History and Physical Interval Note:  06/07/2018 7:21 AM  Haley [name]  has presented today for surgery, with the diagnosis of Left Pleural Effusion.  The various methods of treatment have been discussed with the patient and family. After consideration of risks, benefits and other options for treatment, the patient has consented to  Procedure(s): VIDEO ASSISTED THORACOSCOPY (VATS)/WEDGE RESECTION (Left) DRAINAGE OF PLEURAL EFFUSION, possible Talc Pleurodesis (Left) INSERTION PLEURAL DRAINAGE CATHETER (Left) as a surgical intervention.  The patient's history has been reviewed, patient examined, no change in status, stable for surgery.  I have reviewed the patient's chart and labs.  Questions were answered to the patient's satisfaction.     Melrose Nakayama

## 2018-06-07 NOTE — Transfer of Care (Signed)
Immediate Anesthesia Transfer of Care Note  Patient: Haley [name]  Procedure(s) Performed: VIDEO ASSISTED THORACOSCOPY (VATS), left lung (Left Chest) DRAINAGE OF PLEURAL EFFUSION, Talc Pleurodesis (Left ) INSERTION PLEURAL DRAINAGE CATHETER, left chest (Left )  Patient Location: PACU  Anesthesia Type:General  Level of Consciousness: awake, alert  and oriented  Airway & Oxygen Therapy: Patient Spontanous Breathing and Patient connected to face mask oxygen  Post-op Assessment: Report given to RN, Post -op Vital signs reviewed and stable and Patient moving all extremities  Post vital signs: Reviewed and stable  Last Vitals:  Vitals Value Taken Time  BP 162/104 06/07/2018 10:31 AM  Temp    Pulse    Resp 19 06/07/2018 10:40 AM  SpO2 91 % 06/07/2018 10:35 AM  Vitals shown include unvalidated device data.  Last Pain:  Vitals:   06/07/18 0621  TempSrc: Oral  PainSc: 0-No pain         Complications: No apparent anesthesia complications

## 2018-06-07 NOTE — Anesthesia Postprocedure Evaluation (Signed)
Anesthesia Post Note  Patient: Haley [name]  Procedure(s) Performed: VIDEO ASSISTED THORACOSCOPY (VATS), left lung (Left Chest) DRAINAGE OF PLEURAL EFFUSION, Talc Pleurodesis (Left ) INSERTION PLEURAL DRAINAGE CATHETER, left chest (Left )     Patient location during evaluation: PACU Anesthesia Type: General Level of consciousness: awake and alert Pain management: pain level controlled Vital Signs Assessment: post-procedure vital signs reviewed and stable Respiratory status: spontaneous breathing, nonlabored ventilation, respiratory function stable and patient connected to nasal cannula oxygen Cardiovascular status: blood pressure returned to baseline and stable Postop Assessment: no apparent nausea or vomiting Anesthetic complications: no    Last Vitals:  Vitals:   06/07/18 1250 06/07/18 1530  BP: 112/62   Pulse:    Resp:  20  Temp: 36.8 C   SpO2: 94% 96%    Last Pain:  Vitals:   06/07/18 1530  TempSrc:   PainSc: 3                  Adaleen Hulgan

## 2018-06-07 NOTE — Op Note (Signed)
Haley [name], Haley [name] MEDICAL RECORD [id number] ACCOUNT [id number] DATE OF BIRTH:[birth year] FACILITY: [facility] LOCATION: [facility] PHYSICIAN:Jonica Bickhart Chaya Jan, MD  OPERATIVE REPORT  DATE OF PROCEDURE:  06/07/2018  PREOPERATIVE DIAGNOSIS:  Left pleural effusion.  POSTOPERATIVE DIAGNOSIS:  Malignant left pleural effusion.  PROCEDURES:   Left video-assisted thoracoscopy, Drainage of pleural effusion, Pleural biopsy, Talc pleurodesis, Insertion of a pleural drainage catheter.  SURGEON:  Modesto Charon, MD  ASSISTANT:  Ellwood Handler, PA-C.  ANESTHESIA:  General.  FINDINGS:  Almost 4 liters of straw-colored pleural fluid. 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  Incomplete reexpansion of the lingula.  CLINICAL NOTE:  Haley [name] is a 78 year old woman with a history of thyroid cancer who developed shortness of breath about a month prior.  Chest x-ray showed a large left pleural effusion.  She had multiple thoracenteses draining about a liter and a  half of fluid each time.  She had some symptomatic relief but her symptoms rapidly recurred.  The effusion was never completely drained and would rapidly reaccumulate.  Cytology was negative.  CT of the chest showed a large pleural effusion with multiple  lung nodules bilaterally.  Also there was a question of a gastric tumor.  She was advised to undergo left VATS for drainage of the effusion and pleural biopsy.  We will plan to do talc pleurodesis if the lung will reexpand and if not, we will plan to  place a pleural drainage catheter.  The indications, risks, benefits, and alternatives were discussed in detail with the patient.  She understood and accepted the risks and agreed to proceed.  OPERATIVE NOTE:  The patient was brought to the operating room on 06/07/2018.  She had induction of general anesthesia and was intubated with a double lumen endotracheal tube.  Intravenous antibiotics were administered.   A Foley catheter was placed.   Sequential compression devices were placed on the calves for DVT prophylaxis.  She was placed in a right lateral decubitus position.  Single lung ventilation of the right lung was initiated and was tolerated well throughout the procedure.  The left lung  was prepped and draped in the usual sterile fashion.  A timeout was performed.  A solution containing 20 mL of liposomal bupivacaine, 30 mL of 0.5% bupivacaine and 50 mL of saline was prepared.  This was used for local at the incisions as well as the intercostal nerve blocks.  An incision was made in the  seventh interspace in the midaxillary line.  The chest was bluntly entered using a hemostat.  Amber fluid was evacuated.  A total of almost 4 liters of fluid was removed.  A 4 cm working incision was made in the 5th interspace anterolaterally.  No rib  spreading was performed.  The port was placed through the original incision and the thoracoscope was advanced into the chest.  There were diffuse metastatic implants covering essentially the entire visceral and parietal pleura.  Parietal pleural  biopsies were obtained and sent for frozen section.  Additional tissue was removed and sent for permanent pathology.  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  Primary site was undetermined.    Intercostal nerve blocks were performed from the third to the ninth interspaces, injecting the bupivacaine solution between the ribs in a subpleural plane.  Ventilating the left lung showed good reexpansion of the lower lobe and part of the upper lobe with  minimal reexpansion of the lingula.  The decision was made to attempt talc pleurodesis,  but I also placed a pleural drainage catheter in case the talc was unsuccessful with incomplete reexpansion of the lung.  Four grams of talc was insufflated into the pleural  space with good distribution.  The pleural catheter was placed through a separate small incision anterior to  the original port incision and exited near the costal margin more anteriorly.  It was secured at the exit site with a 3-0 nylon suture.  A  28-French Blake drain was placed through the original port incision and secured with #1 silk suture.  The Blake drain was placed to a Pleur-evac.  The PleurX catheter was capped and dressed at the completion of the procedure.    Dual lung ventilation was resumed.  The working incision was closed with #1 Vicryl fascial suture, 2-0 Vicryl subcutaneous suture and 3-0 Vicryl subcuticular suture.    All sponge, needle and instrument counts were correct at the end of the procedure.  The patient was extubated in the operating room and taken to the [facility] in good condition.  AN/NUANCE  D:06/07/2018 T:06/07/2018 JOB:[id number]

## 2018-06-07 NOTE — Anesthesia Procedure Notes (Signed)
Procedure Name: Intubation Date/Time: 06/07/2018 8:00 AM Performed by: Breana Litts T, CRNA Pre-anesthesia Checklist: Patient identified, Emergency Drugs available, Suction available and Patient being monitored Patient Re-evaluated:Patient Re-evaluated prior to induction Oxygen Delivery Method: Circle system utilized Preoxygenation: Pre-oxygenation with 100% oxygen Induction Type: IV induction and Rapid sequence Ventilation: Mask ventilation without difficulty Laryngoscope Size: Glidescope and 3 Endobronchial tube: Left, Double lumen EBT, EBT position confirmed by auscultation and EBT position confirmed by fiberoptic bronchoscope and 37 Fr Number of attempts: 2 Airway Equipment and Method: Patient positioned with wedge pillow,  Stylet and Video-laryngoscopy Placement Confirmation: ETT inserted through vocal cords under direct vision,  positive ETCO2 and breath sounds checked- equal and bilateral Secured at: 29 cm Tube secured with: Tape Dental Injury: Bloody posterior oropharynx  Difficulty Due To: Difficulty was anticipated, Difficult Airway- due to anterior larynx and Difficult Airway- due to limited oral opening Future Recommendations: Recommend- induction with short-acting agent, and alternative techniques readily available

## 2018-06-07 NOTE — Anesthesia Procedure Notes (Addendum)
Arterial Line Insertion Start/End3/25/2020 8:24 AM, 06/07/2018 8:27 AM Performed by: Oleta Mouse, MD, anesthesiologist  Patient location: OR. Preanesthetic checklist: patient identified, IV checked, site marked, risks and benefits discussed, surgical consent, monitors and equipment checked, pre-op evaluation, timeout performed and anesthesia consent Right, radial was placed Catheter size: 20 G Hand hygiene performed   Attempts: 1 Procedure performed without using ultrasound guided technique. Following insertion, dressing applied. Post procedure assessment: normal and unchanged  Patient tolerated the procedure well with no immediate complications. Additional procedure comments: Multiple attempts by CRNAs bilateral, flow obtained however unable to thread catheter on previous attempts.

## 2018-06-07 NOTE — Brief Op Note (Signed)
06/07/2018  9:43 AM  PATIENT:  Charlotte Crumb  78 y.o. female  PRE-OPERATIVE DIAGNOSIS:  Left Pleural Effusion  POST-OPERATIVE DIAGNOSIS:  Left Pleural Effusion  PROCEDURE:  Procedure(s):  LEFT VIDEO ASSISTED THORACOSCOPY  DRAINAGE OF PLEURAL EFFUSION,  TALC PLEURODESIS PLEURAL BIOPSY INSERTION PLEURAL DRAINAGE CATHETER  SURGEON:  Surgeon(s) and Role:    * Melrose Nakayama, MD - Primary  PHYSICIAN ASSISTANT: Ellwood Handler PA-C  ANESTHESIA:   general  EBL:  Minimal  BLOOD ADMINISTERED:none  DRAINS: 28 Blake Drain, Pleur-x catheter   LOCAL MEDICATIONS USED:  BUPIVICAINE   SPECIMEN:  Source of Specimen:  Pleural fluid, Parietal Pleura  DISPOSITION OF SPECIMEN:  PATHOLOGY  COUNTS:  YES  TOURNIQUET:  * No tourniquets in log *  DICTATION: .Dragon Dictation  PLAN OF CARE: Admit to inpatient   PATIENT DISPOSITION:  PACU - hemodynamically stable.   Delay start of Pharmacological VTE agent (>24hrs) due to surgical blood loss or risk of bleeding: no

## 2018-06-07 NOTE — Anesthesia Procedure Notes (Signed)
Central Venous Catheter Insertion Performed by: Oleta Mouse, MD, anesthesiologist Start/End3/25/2020 7:23 AM, 06/07/2018 7:29 AM Patient location: Pre-op. Preanesthetic checklist: patient identified, IV checked, site marked, risks and benefits discussed, surgical consent, monitors and equipment checked, pre-op evaluation, timeout performed and anesthesia consent Position: supine Lidocaine 1% used for infiltration and patient sedated Hand hygiene performed  and maximum sterile barriers used  Catheter size: 8 Fr Total catheter length 16. Central line was placed.Double lumen Procedure performed using ultrasound guided technique. Ultrasound Notes:image(s) printed for medical record Attempts: 1 Following insertion, dressing applied, line sutured and Biopatch. Post procedure assessment: blood return through all ports, free fluid flow and no air  Patient tolerated the procedure well with no immediate complications.

## 2018-06-08 ENCOUNTER — Inpatient Hospital Stay (HOSPITAL_COMMUNITY): Payer: Medicare Other

## 2018-06-08 ENCOUNTER — Encounter (HOSPITAL_COMMUNITY): Payer: Self-pay | Admitting: Thoracic Surgery (Cardiothoracic Vascular Surgery)

## 2018-06-08 LAB — BASIC METABOLIC PANEL
ANION GAP: 8 (ref 5–15)
BUN: 9 mg/dL (ref 8–23)
CALCIUM: 8.1 mg/dL — AB (ref 8.9–10.3)
CO2: 28 mmol/L (ref 22–32)
Chloride: 98 mmol/L (ref 98–111)
Creatinine, Ser: 0.64 mg/dL (ref 0.44–1.00)
GFR calc Af Amer: >60 mL/min (ref >60)
GFR calc non Af Amer: >60 mL/min (ref >60)
Glucose, Bld: 157 mg/dL — ABNORMAL HIGH (ref 70–99)
Potassium: 3.5 mmol/L (ref 3.5–5.1)
Sodium: 134 mmol/L — ABNORMAL LOW (ref 135–145)

## 2018-06-08 LAB — CBC
HEMATOCRIT: 38.9 % (ref 36.0–46.0)
Hemoglobin: 12 g/dL (ref 12.0–15.0)
MCH: 26.7 pg (ref 26.0–34.0)
MCHC: 30.8 g/dL (ref 30.0–36.0)
MCV: 86.4 fL (ref 80.0–100.0)
Platelets: 244 10*3/uL (ref 150–400)
RBC: 4.5 MIL/uL (ref 3.87–5.11)
RDW: 14.9 % (ref 11.5–15.5)
WBC: 12.8 10*3/uL — ABNORMAL HIGH (ref 4.0–10.5)
nRBC: 0 % (ref 0.0–0.2)

## 2018-06-08 LAB — BLOOD GAS, ARTERIAL
Acid-Base Excess: 5.3 mmol/L — ABNORMAL HIGH (ref 0.0–2.0)
Bicarbonate: 29.4 mmol/L — ABNORMAL HIGH (ref 20.0–28.0)
Drawn by: 51133
FIO2: 21
O2 Saturation: 92.1 %
Patient temperature: 98.6
pCO2 arterial: 43.9 mmHg (ref 32.0–48.0)
pH, Arterial: 7.441 (ref 7.350–7.450)
pO2, Arterial: 61.8 mmHg — ABNORMAL LOW (ref 83.0–108.0)

## 2018-06-08 NOTE — Discharge Summary (Addendum)
Physician Discharge Summary  Patient ID: Haley [name] MRN: [id number] DOB/AGE: Feb 18, 1941 78 y.o.  Admit date: 06/07/2018 Discharge date: 06/12/2018  Admission Diagnoses:  Patient Active Problem List   Diagnosis Date Noted  . 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 Pleural effusion on left with suspcious cytology in pt with h/o thyroid ca  05/11/2018  . Chronic cough 05/11/2018  . Coronary artery calcification seen on CT scan 09/21/2016  . Hypertension 09/21/2016  . Hyperlipidemia 09/21/2016  . Papillary thyroid carcinoma ([place]) 08/27/2015  . Multiple thyroid nodules 08/27/2015   Discharge Diagnoses:   Patient Active Problem List   Diagnosis Date Noted  . S/P Left VATS, drainage of pleural effusion, pleural biopsy, insertion of Pleur-x catheter 06/07/2018  . 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 Pleural effusion on left with suspcious cytology in pt with h/o thyroid ca  05/11/2018  . Chronic cough 05/11/2018  . Coronary artery calcification seen on CT scan 09/21/2016  . Hypertension 09/21/2016  . Hyperlipidemia 09/21/2016  . Papillary thyroid carcinoma ([place]) 08/27/2015  . Multiple thyroid nodules 08/27/2015   Discharged Condition: good  History of Present Illness:  Haley [name] is a 78 yo woman with known history of papillary thyroid cancer, basal cell caner, HTN, Hyperlipidemia, osteopenia, and reflux.  The patient was in her usual state of health until about 1 month ago.  The patient developed complaints of shortness of breath with exertion and this progressed to shortness of breath with rest.  Upon evaluation CXR showed a large left pleural effusion.  She was referred for Thoracentesis which did not resolve the problem.  Over 3 weeks time she had the procedure performed on 3 different occasions and each time about 1.5 L of fluid was removed.  Cytology studies obtained showed atypical cells but were not definitively diagnostic.  CT scan of the chest was obtained and again  showed a large pleural effusion with multiple lung nodules bilaterally and a possible gastric tumor.  She was referred to [facility] for VATS procedure.    She was evaluated by Dr. Roxan Hockey at which time she denied chest pain, pressure, or tightness.  She denies weight loss and appetite change.  She also denied abdominal pain.  She continued to have constant shortness of breath.  It was felt she would benefit from VATS procedure with drainage of effusion, talc pleurodesis, and possible lung biopsy.  The risks and benefits of the procedure were explained to the patient and she was agreeable to proceed.  Hospital Course:   Haley [name] presented to [facility] on [facility]08/2018.  She was taken to the operating room and underwent Left VATS with drainage of pleural effusion, tacl pleurodesis, pleural biopsy, insertion of Pleur-x catheter, and intercostal nerve block.  The patient tolerated the procedure without difficulty, was extubated and taken to the PACU in stable condition.  The patient has done well post operatively.  Her chest tube remained in place until output decreased. Her CXR remained stable without pneumothorax.  Her chest tube was removed 06/12/2018.  Follow up CXR showed residual left loculated pleural effusion.  Pleur-x drainage was started on 06/12/2018.  This will be performed every other day, home health arrangements have been made.  She is ambulating.  She is medically stable for discharge home today.   Significant Diagnostic Studies: radiology:   CT scan: 1. Moderate left-sided pleural effusion, with mild anterior loculation. 2. Innumerable bilateral pulmonary nodules, highly suspicious for metastatic disease. Given the clinical history, possibly related to metastatic thyroid carcinoma.  3. Abnormal appearance of the proximal posterior stomach. Although this could represent a gastric diverticulum, a cavitary exophytic lesion such as gastrointestinal stromal tumor could have a  similar appearance (especially given adjacent gastrohepatic ligament adenopathy). Correlate with results of thoracentesis. Consider PET and/or endoscopy. 4. Coronary artery atherosclerosis. Aortic Atherosclerosis  Treatments: surgery:   Left video-assisted thoracoscopy, drainage of pleural effusion, pleural biopsy, talc pleurodesis and insertion of a pleural drainage catheter.  Discharge Exam: Blood pressure 139/69, pulse (!) 105, temperature 98.2 F (36.8 C), temperature source Oral, resp. rate (!) 21, height 5\' 6"  (1.676 m), weight 69.2 kg, SpO2 96 %.  General appearance: alert, cooperative and no distress Heart: regular rate and rhythm Lungs: diminished breath sounds left base Abdomen: soft, non-tender; bowel sounds normal; no masses,  no organomegaly Extremities: extremities normal, atraumatic, no cyanosis or edema Wound: clean and dry  Discharge disposition: 01-Home or Self Care  Discharge Medications:   Allergies as of 06/12/2018      Reactions   Erythromycin Nausea And Vomiting   Other Hives, Nausea And Vomiting, Other (See Comments)   Amaretto   Shellfish Allergy Nausea And Vomiting   Codeine Nausea And Vomiting   Eggs Or Egg-derived Products Nausea And Vomiting, Rash   Fish Allergy Nausea And Vomiting, Rash   Niacin And Related Rash   Oregano [origanum Oil] Nausea And Vomiting, Rash   Tetanus Toxoids Rash, Other (See Comments)   High fever, body aches - if given she has to receive in 2 shots to prevent reaction      Medication List    TAKE these medications   acetaminophen 500 MG tablet Commonly known as:  TYLENOL Take 2 tablets (1,000 mg total) by mouth every 6 (six) hours as needed.   calcium carbonate 1250 (500 Ca) MG tablet Commonly known as:  OS-CAL - dosed in mg of elemental calcium Take 2 tablets (1,000 mg of elemental calcium total) by mouth 2 (two) times daily with a meal.   captopril 25 MG tablet Commonly known as:  CAPOTEN Take 25 mg by mouth 3  (three) times daily.   cimetidine 200 MG tablet Commonly known as:  TAGAMET Take 200 mg by mouth every morning.   fexofenadine 180 MG tablet Commonly known as:  ALLEGRA Take 180 mg by mouth daily.   ibuprofen 200 MG tablet Commonly known as:  ADVIL,MOTRIN Take 200 mg by mouth every 6 (six) hours as needed for moderate pain.   ondansetron 4 MG tablet Commonly known as:  Zofran Take 1 tablet (4 mg total) by mouth every 8 (eight) hours as needed for nausea or vomiting.   REFRESH TEARS OP Place 1-2 drops into both eyes as needed (For dry eyes.).   rosuvastatin 20 MG tablet Commonly known as:  CRESTOR Take 20 mg by mouth at bedtime.   Synthroid 125 MCG tablet Generic drug:  levothyroxine Take 125 mcg by mouth See admin instructions. Take 125 mcg by mouth daily except Saturday take 62.5 mcg   traMADol 50 MG tablet Commonly known as:  ULTRAM Take 1-2 tablets (50-100 mg total) by mouth every 6 (six) hours as needed (mild pain). What changed:    how much to take  how to take this  when to take this  reasons to take this  additional instructions   Vitamin D (Ergocalciferol) 1.25 MG (50000 UT) Caps capsule Commonly known as:  DRISDOL Take 50,000 Units by mouth every Wednesday.      Follow-up Information    Health,  [facility] Follow [facility].   Specialty:  Home Health Services Why:  Registered Nurse       Melrose Nakayama, MD Follow up.   Specialty:  Cardiothoracic Surgery Why:  Your first visit will be a phone visit. We will call you in 2 weeks.  Please obtain CXR at your local hospital.  Our office will mail your physical appointment date and time... Please dont hesitate if the need arises to be seen prior. Contact information: [address] [place]  [id number] [phone]           Signed:  Ellwood Handler PA-C 06/12/2018, 10:24 AM

## 2018-06-08 NOTE — Progress Notes (Addendum)
      [address]       [place] [address]             [phone]      1 Day Post-Op Procedure(s) (LRB): VIDEO ASSISTED THORACOSCOPY (VATS), left lung (Left) DRAINAGE OF PLEURAL EFFUSION, Talc Pleurodesis (Left) INSERTION PLEURAL DRAINAGE CATHETER, left chest (Left)   Subjective:  Doing okay.  She denies N/V, up in bed eating breakfast.  Pain is controlled with PCA.  Objective: Vital signs in last 24 hours: Temp:  [97 F (36.1 C)-98.3 F (36.8 C)] 98.1 F (36.7 C) (03/26 0318) Pulse Rate:  [87-120] 87 (03/26 0300) Cardiac Rhythm: Normal sinus rhythm (03/26 0318) Resp:  [11-21] 19 (03/26 0318) BP: (112-162)/(59-104) 113/60 (03/26 0300) SpO2:  [92 %-98 %] 95 % (03/26 0318) Arterial Line BP: (142-189)/(57-90) 149/57 (03/26 0300)  Intake/Output from previous day: 03/25 0701 - 03/26 0700 In: 2672.8 [I.V.:2572.8; IV Piggyback:100] Out: 4990 [Urine:150; Blood:200; Chest Tube:840]  General appearance: alert, cooperative and no distress Heart: regular rate and rhythm Lungs: diminished breath sounds LLL Abdomen: soft, non-tender; bowel sounds normal; no masses,  no organomegaly Wound: clean and dry  Lab Results: Recent Labs    06/05/18 1129 06/08/18 0206  WBC 13.1* 12.8*  HGB 14.7 12.0  HCT 49.0* 38.9  PLT 297 244   BMET:  Recent Labs    06/05/18 1129 06/08/18 0206  NA 137 134*  K 3.9 3.5  CL 102 98  CO2 22 28  GLUCOSE 144* 157*  BUN 13 9  CREATININE 0.63 0.64  CALCIUM 9.1 8.1*    PT/INR:  Recent Labs    06/05/18 1129  LABPROT 13.8  INR 1.1   ABG    Component Value Date/Time   PHART 7.441 06/08/2018 0336   HCO3 29.4 (H) 06/08/2018 0336   O2SAT 92.1 06/08/2018 0336   CBG (last 3)  No results for input(s): GLUCAP in the last 72 hours.  Assessment/Plan: S/P Procedure(s) (LRB): VIDEO ASSISTED THORACOSCOPY (VATS), left lung (Left) DRAINAGE OF PLEURAL EFFUSION, Talc Pleurodesis (Left) INSERTION PLEURAL DRAINAGE CATHETER, left  chest (Left)  1. Chest tube- 950 cc output since surgery, leave on suction today 2. CV- hemodynamically stable in NSR, home BP medications ordered 3. Pulm- no acute issues, CXR without pneumothorax,  Mild sub q air on left, continue IS 4. D/C Arterial Line 5. IV fluids to KVO 6. Lovenox for DVT prophylaxis 7. dispo- patient stable, will leave chest tube in place until output decreases, then can remove and start drainage via Pleur-x catheter, repeat CXR in AM   LOS: 1 day    Ellwood Handler 06/08/2018 Patient seen and examined, agree with above CXR looks good. No air leak- keep on suction today, hopefully talc will be effective Pain well controlled Path pending  Remo Lipps C. Roxan Hockey, MD [facility] [phone]

## 2018-06-08 NOTE — Plan of Care (Signed)

## 2018-06-09 ENCOUNTER — Inpatient Hospital Stay (HOSPITAL_COMMUNITY): Payer: Medicare Other

## 2018-06-09 LAB — CBC
HEMATOCRIT: 37.4 % (ref 36.0–46.0)
Hemoglobin: 11.9 g/dL — ABNORMAL LOW (ref 12.0–15.0)
MCH: 27.9 pg (ref 26.0–34.0)
MCHC: 31.8 g/dL (ref 30.0–36.0)
MCV: 87.6 fL (ref 80.0–100.0)
NRBC: 0 % (ref 0.0–0.2)
Platelets: 218 10*3/uL (ref 150–400)
RBC: 4.27 MIL/uL (ref 3.87–5.11)
RDW: 15.2 % (ref 11.5–15.5)
WBC: 11.6 10*3/uL — ABNORMAL HIGH (ref 4.0–10.5)

## 2018-06-09 LAB — COMPREHENSIVE METABOLIC PANEL
ALK PHOS: 49 U/L (ref 38–126)
ALT: 12 U/L (ref 0–44)
AST: 15 U/L (ref 15–41)
Albumin: 2 g/dL — ABNORMAL LOW (ref 3.5–5.0)
Anion gap: 11 (ref 5–15)
BILIRUBIN TOTAL: 0.6 mg/dL (ref 0.3–1.2)
BUN: 12 mg/dL (ref 8–23)
CO2: 27 mmol/L (ref 22–32)
Calcium: 8 mg/dL — ABNORMAL LOW (ref 8.9–10.3)
Chloride: 93 mmol/L — ABNORMAL LOW (ref 98–111)
Creatinine, Ser: 0.44 mg/dL (ref 0.44–1.00)
GFR calc Af Amer: >60 mL/min (ref >60)
Glucose, Bld: 99 mg/dL (ref 70–99)
Potassium: 3.6 mmol/L (ref 3.5–5.1)
Sodium: 131 mmol/L — ABNORMAL LOW (ref 135–145)
TOTAL PROTEIN: 4.5 g/dL — AB (ref 6.5–8.1)

## 2018-06-09 NOTE — Progress Notes (Signed)
Pt ambulated in a hallway twice without distress. PCA pump continue but pt demand is just 1 time in 4 hours, Chest tube drainage is 140cc in this shift, pt is still nauseous but Reglan is working.Will continue to monitor  [name], RN

## 2018-06-09 NOTE — Progress Notes (Addendum)
      [address]       [place], [address]             [phone]      2 Days Post-Op Procedure(s) (LRB): VIDEO ASSISTED THORACOSCOPY (VATS), left lung (Left) DRAINAGE OF PLEURAL EFFUSION, Talc Pleurodesis (Left) INSERTION PLEURAL DRAINAGE CATHETER, left chest (Left)   Subjective:  No new complaints.  Feels well.  Pain is well controlled  Objective: Vital signs in last 24 hours: Temp:  [97.7 F (36.5 C)-98.2 F (36.8 C)] 97.8 F (36.6 C) (03/27 0735) Pulse Rate:  [86-104] 93 (03/27 0735) Cardiac Rhythm: Normal sinus rhythm (03/27 0735) Resp:  [11-19] 15 (03/27 0735) BP: (117-142)/(52-68) 142/62 (03/27 0735) SpO2:  [91 %-98 %] 94 % (03/27 0735)  Intake/Output from previous day: 03/26 0701 - 03/27 0700 In: 600 [P.O.:600] Out: 2170 [Urine:1450; Chest Tube:720]  General appearance: alert, cooperative and no distress Heart: regular rate and rhythm Lungs: diminished breath sounds left base Abdomen: soft, non-tender; bowel sounds normal; no masses,  no organomegaly Wound: clean and dry  Lab Results: Recent Labs    06/08/18 0206 06/09/18 0447  WBC 12.8* 11.6*  HGB 12.0 11.9*  HCT 38.9 37.4  PLT 244 218   BMET:  Recent Labs    06/08/18 0206 06/09/18 0447  NA 134* 131*  K 3.5 3.6  CL 98 93*  CO2 28 27  GLUCOSE 157* 99  BUN 9 12  CREATININE 0.64 0.44  CALCIUM 8.1* 8.0*    PT/INR: No results for input(s): LABPROT, INR in the last 72 hours. ABG    Component Value Date/Time   PHART 7.441 06/08/2018 0336   HCO3 29.4 (H) 06/08/2018 0336   O2SAT 92.1 06/08/2018 0336   CBG (last 3)  No results for input(s): GLUCAP in the last 72 hours.  Assessment/Plan: S/P Procedure(s) (LRB): VIDEO ASSISTED THORACOSCOPY (VATS), left lung (Left) DRAINAGE OF PLEURAL EFFUSION, Talc Pleurodesis (Left) INSERTION PLEURAL DRAINAGE CATHETER, left chest (Left)  1. Chest tube- 650 cc output yesterday, no air leak- leave on suction, chest tube will remain in  place until output decreases to 250 2. CV- hemodynamically stable in NSR, continue home BP medications 3. Pulm- no acute issues, off oxygen, no pneumothorax continue aggressive IS 4. Lovenox for DVT prophylaxis 5. Dispo- patient stable, chest tube output down to 650, leave chest tube in place, repeat CXR in AM   LOS: 2 days    Ellwood Handler 06/09/2018   Chart reviewed, patient examined, agree with above. CXR stable. Can put chest tube to water seal tomorrow and remove when drainage decreases.

## 2018-06-09 NOTE — TOC Progression Note (Addendum)
Transition of Care [facility]) - Progression Note    Patient Details  Name: Haley [name] MRN: [id number] Date of Birth: 05-27-1940  [facility] of [facility]) CM/SW Contact  Maryclare Labrador, RN Phone Number: 06/09/2018, 11:38 AM  Update:  [facility] accepted referral  Clinical Narrative:   Pt is s/p VATS.  PTA independent from home with husband.  Pt has PCP and denied barriers with paying for medications.  Pt will discharge home with Pleurx catheter and will need HHRN.  CM provided Medicare.gov HH list via phone - pt is interested in [facility] - agency contacted and referral is under review.  CM requested box of catheters be ordered and given to pt prior to discharge.    Expected Discharge Plan: [place]    Expected Discharge Plan and Services Expected Discharge Plan: [place] arrangements for the past 2 months: [place]: RN [facility] Agency: [place] ([facility])   Social Determinants of Health (SDOH) Interventions    Readmission Risk Interventions No flowsheet data found.

## 2018-06-09 NOTE — Care Management Important Message (Signed)
Important Message  Patient Details  Name: Haley [name] MRN: [id number] Date of Birth: May 10, 1940   Medicare Important Message Given:  Yes    Trixie Maclaren Montine Circle 06/09/2018, 4:40 PM

## 2018-06-10 ENCOUNTER — Inpatient Hospital Stay (HOSPITAL_COMMUNITY): Payer: Medicare Other

## 2018-06-10 NOTE — Plan of Care (Signed)

## 2018-06-10 NOTE — Progress Notes (Signed)
PCA discontinued per order. Wasted 9.3ml  Morphine 2mg /mL and witness with Mliss Sax, RN.

## 2018-06-10 NOTE — Progress Notes (Addendum)
      [address]       [place] [address]             [phone]      3 Days Post-Op Procedure(s) (LRB): VIDEO ASSISTED THORACOSCOPY (VATS), left lung (Left) DRAINAGE OF PLEURAL EFFUSION, Talc Pleurodesis (Left) INSERTION PLEURAL DRAINAGE CATHETER, left chest (Left) Subjective: She feels okay this morning. Better than yesterday. She plans to walk in the halls.   Objective: Vital signs in last 24 hours: Temp:  [97.8 F (36.6 C)-98.8 F (37.1 C)] 98.4 F (36.9 C) (03/28 0810) Pulse Rate:  [93-106] 93 (03/28 0343) Cardiac Rhythm: Normal sinus rhythm (03/28 0700) Resp:  [12-23] 15 (03/28 0740) BP: (124-148)/(50-71) 124/61 (03/28 0810) SpO2:  [95 %-98 %] 97 % (03/28 0740)     Intake/Output from previous day: 03/27 0701 - 03/28 0700 In: 1388.2 [P.O.:480; I.V.:908.2] Out: 2955 [Urine:2405; Chest Tube:550] Intake/Output this shift: No intake/output data recorded.  General appearance: alert, cooperative and no distress Heart: regular rate and rhythm, S1, S2 normal, no murmur, click, rub or gallop Lungs: clear to auscultation bilaterally Abdomen: soft, non-tender; bowel sounds normal; no masses,  no organomegaly Extremities: extremities normal, atraumatic, no cyanosis or edema Wound: clean and dry  Lab Results: Recent Labs    06/08/18 0206 06/09/18 0447  WBC 12.8* 11.6*  HGB 12.0 11.9*  HCT 38.9 37.4  PLT 244 218   BMET:  Recent Labs    06/08/18 0206 06/09/18 0447  NA 134* 131*  K 3.5 3.6  CL 98 93*  CO2 28 27  GLUCOSE 157* 99  BUN 9 12  CREATININE 0.64 0.44  CALCIUM 8.1* 8.0*    PT/INR: No results for input(s): LABPROT, INR in the last 72 hours. ABG    Component Value Date/Time   PHART 7.441 06/08/2018 0336   HCO3 29.4 (H) 06/08/2018 0336   O2SAT 92.1 06/08/2018 0336   CBG (last 3)  No results for input(s): GLUCAP in the last 72 hours.  Assessment/Plan: S/P Procedure(s) (LRB): VIDEO ASSISTED THORACOSCOPY (VATS), left lung  (Left) DRAINAGE OF PLEURAL EFFUSION, Talc Pleurodesis (Left) INSERTION PLEURAL DRAINAGE CATHETER, left chest (Left)  1. Chest tube with 550cc/24 hours. Will change to water seal. Continue until output is < 250cc/24 hours. CXR shows no pneumothorax, left chest volume loss with similar basilar atelectasis.  2. Remains in NSR, rate in the 90s.  3. Pulm-tolerating room air with good oxygen saturation 4. H and H stable 5. Continue Lovenox for DVT prophylaxis  Plan: change chest tube to water seal since there is no air leak and CXR is stable. Will follow drainage. CXR in the AM. Will discontinue PCA since she has not used it in the last 24 hours. Continue Tramadol for pain.     LOS: 3 days    Elgie Collard 06/10/2018   Chart reviewed, patient examined, agree with above. Chest tube output 550 cc for 24 hrs. Will put to water seal and follow.

## 2018-06-11 ENCOUNTER — Inpatient Hospital Stay (HOSPITAL_COMMUNITY): Payer: Medicare Other

## 2018-06-11 NOTE — Progress Notes (Signed)
Chest tube removed per order. Petrolatum gauze dressing used to cover the site of the chest tube. Pt advised to lay in bed for 30 min. Will continue to monitor pt.

## 2018-06-11 NOTE — Progress Notes (Addendum)
      [address]       [place] [address]             [phone]      4 Days Post-Op Procedure(s) (LRB): VIDEO ASSISTED THORACOSCOPY (VATS), left lung (Left) DRAINAGE OF PLEURAL EFFUSION, Talc Pleurodesis (Left) INSERTION PLEURAL DRAINAGE CATHETER, left chest (Left) Subjective: No issues overnight.   Objective: Vital signs in last 24 hours: Temp:  [97.8 F (36.6 C)-98.2 F (36.8 C)] 98 F (36.7 C) (03/29 0807) Pulse Rate:  [91-104] 96 (03/29 0807) Cardiac Rhythm: Normal sinus rhythm (03/29 0700) Resp:  [11-32] 17 (03/29 0807) BP: (114-150)/(59-74) 115/67 (03/29 0807) SpO2:  [92 %-97 %] 92 % (03/29 0807)     Intake/Output from previous day: 03/28 0701 - 03/29 0700 In: 290 [P.O.:290] Out: 2 [Urine:1; Stool:1] Intake/Output this shift: Total I/O In: 240 [P.O.:240] Out: 0   General appearance: alert, cooperative and no distress Heart: regular rate and rhythm, S1, S2 normal, no murmur, click, rub or gallop Lungs: clear to auscultation bilaterally Abdomen: soft, non-tender; bowel sounds normal; no masses,  no organomegaly Extremities: extremities normal, atraumatic, no cyanosis or edema Wound: clean and dry  Lab Results: Recent Labs    06/09/18 0447  WBC 11.6*  HGB 11.9*  HCT 37.4  PLT 218   BMET:  Recent Labs    06/09/18 0447  NA 131*  K 3.6  CL 93*  CO2 27  GLUCOSE 99  BUN 12  CREATININE 0.44  CALCIUM 8.0*    PT/INR: No results for input(s): LABPROT, INR in the last 72 hours. ABG    Component Value Date/Time   PHART 7.441 06/08/2018 0336   HCO3 29.4 (H) 06/08/2018 0336   O2SAT 92.1 06/08/2018 0336   CBG (last 3)  No results for input(s): GLUCAP in the last 72 hours.  Assessment/Plan: S/P Procedure(s) (LRB): VIDEO ASSISTED THORACOSCOPY (VATS), left lung (Left) DRAINAGE OF PLEURAL EFFUSION, Talc Pleurodesis (Left) INSERTION PLEURAL DRAINAGE CATHETER, left chest (Left)   1. Chest tube with 150cc/24 hours. There was no  output recorded but after speaking with the nurse there was 110cc during her shift and 40cc over night. CXR stable this morning. No pneumothorax.  2. Remains in NSR, rate in the 90s. BP well controlled.   3. Pulm-tolerating room air with good oxygen saturation 4. H and H stable 5. Continue Lovenox for DVT prophylaxis  Plan: Remove chest tube today. CXR in the morning. If no change can likely discharge.    LOS: 4 days    Elgie Collard 06/11/2018   Chart reviewed, patient examined, agree with above. Chest tube output had decreased significantly so will remove chest tube and plan to drain PleurX tomorrow, then send home with drainage every other day.

## 2018-06-12 ENCOUNTER — Inpatient Hospital Stay (HOSPITAL_COMMUNITY): Payer: Medicare Other

## 2018-06-12 MED ORDER — TRAMADOL HCL 50 MG PO TABS: 50.0000 mg | ORAL_TABLET | Freq: Four times a day (QID) | ORAL | 0 refills | Status: DC | PRN

## 2018-06-12 MED ORDER — ONDANSETRON HCL 4 MG PO TABS: 4.0000 mg | ORAL_TABLET | Freq: Three times a day (TID) | ORAL | 1 refills | Status: AC | PRN

## 2018-06-12 MED ORDER — ACETAMINOPHEN 500 MG PO TABS: 1000.0000 mg | ORAL_TABLET | Freq: Four times a day (QID) | ORAL | 0 refills | Status: AC | PRN

## 2018-06-12 NOTE — Discharge Instructions (Signed)
Home health nurse please remove chest tube sutures in 7 days...Marland Kitchen please cut suture on one side of the knot and remove  Discharge Instructions:  1. You may shower, please wash incisions daily with soap and water and keep dry.  If you wish to cover wounds with dressing you may do so but please keep clean and change daily.  No tub baths or swimming until incisions have completely healed.  If your incisions become red or develop any drainage please call our office at [phone]  2. No Driving until cleared by Dr. Leonarda Salon office and you are no longer using narcotic pain medications  3. Fever of 101.5 for at least 24 hours with no source, please contact our office at [phone]  4. Activity- up as tolerated, please walk at least 3 times per day.  Avoid strenuous activity for several weeks.  Due to COVID 19 please ambulate in your house or own yard, avoid all public places... with recent lung surgery you will be at increased risk of developing the virus.  6. If any questions or concerns arise, please do not hesitate to contact our office at [phone]

## 2018-06-12 NOTE — Progress Notes (Signed)
Left PleurX drained per protocol for 52ml serosanguinous drainage.  Patient instructed how to drain; however, HH RN will be assisting and educating husband upon their visits.  Discharge instructions provided and reviewed w/ patient w/ verbal understanding.

## 2018-06-12 NOTE — Progress Notes (Addendum)
      [address]       [place] [address]             [phone]      5 Days Post-Op Procedure(s) (LRB): VIDEO ASSISTED THORACOSCOPY (VATS), left lung (Left) DRAINAGE OF PLEURAL EFFUSION, Talc Pleurodesis (Left) INSERTION PLEURAL DRAINAGE CATHETER, left chest (Left)   Subjective:  No new complaints.  Requests zofran for discharge and also asks if she can get pain medication.  Objective: Vital signs in last 24 hours: Temp:  [97.7 F (36.5 C)-99.4 F (37.4 C)] 98.4 F (36.9 C) (03/30 0317) Pulse Rate:  [96-115] 100 (03/30 0317) Cardiac Rhythm: Sinus tachycardia (03/30 0317) Resp:  [14-22] 22 (03/30 0317) BP: (115-153)/(54-81) 134/54 (03/30 0317) SpO2:  [92 %-100 %] 100 % (03/30 0317)  Intake/Output from previous day: 03/29 0701 - 03/30 0700 In: 480 [P.O.:480] Out: 0   General appearance: alert, cooperative and no distress Heart: regular rate and rhythm Lungs: diminished breath sounds left base Abdomen: soft, non-tender; bowel sounds normal; no masses,  no organomegaly Extremities: extremities normal, atraumatic, no cyanosis or edema Wound: clean and dry  Lab Results: No results for input(s): WBC, HGB, HCT, PLT in the last 72 hours. BMET: No results for input(s): NA, K, CL, CO2, GLUCOSE, BUN, CREATININE, CALCIUM in the last 72 hours.  PT/INR: No results for input(s): LABPROT, INR in the last 72 hours. ABG    Component Value Date/Time   PHART 7.441 06/08/2018 0336   HCO3 29.4 (H) 06/08/2018 0336   O2SAT 92.1 06/08/2018 0336   CBG (last 3)  No results for input(s): GLUCAP in the last 72 hours.  Assessment/Plan: S/P Procedure(s) (LRB): VIDEO ASSISTED THORACOSCOPY (VATS), left lung (Left) DRAINAGE OF PLEURAL EFFUSION, Talc Pleurodesis (Left) INSERTION PLEURAL DRAINAGE CATHETER, left chest (Left)  1. CV- NSR, BP controlled- captopril 2. Pulm- chest tube removed yesterday, CXR not yet completed, will review, start drainage of Pleur-x catheter  today 3. GI- nausea, will give zofran for discharge, diarrhea due to stool softners, resolved, will stop softners 4. H/H arrangements have been made 5. Dispo- patient stable, start draining pleur-x today, await CXR results if stable will d/c home today 4.    LOS: 5 days    Ellwood Handler 06/12/2018 Patient seen and examined, agree with above Final path still pending. Will arrange for consultation with Dr. Hinton Rao in [place] CXR shows some fluid loculated posteriorly-will send home on every other day drainage of pleural catheter. Hopefully, it will drain that area  [place]. Roxan Hockey, MD [facility] [phone]

## 2018-06-12 NOTE — TOC Transition Note (Addendum)
Transition of Care [facility]) - CM/SW Discharge Note   Patient Details  Name: Haley [name] MRN: [id number] Date of Birth: [birth year]  [facility] CM/SW Contact:  Maryclare Labrador, RN Phone Number: 06/12/2018, 10:24 AM   Clinical Narrative:   Pt to discharge home today. Per bedside nurse pt will discharge home with 9 Pleurx catheters.  [facility] will [facility] today with every other day of drainage planned.  [place] form protocol will be facilitated by CSW.  CM signing off          Patient Goals and CMS Choice Patient states their goals for this hospitalization and ongoing recovery are:: (To get back to home and family, pt is eager to get back to her daily living activities ) CMS Medicare.gov Compare Post Acute Care list provided to:: Patient Choice offered to / list presented to : Patient  Discharge Placement                       Discharge Plan and Services                    HH Arranged: RN [facility] Agency: [facility]   Social Determinants of Health (SDOH) Interventions     Readmission Risk Interventions No flowsheet data found.

## 2018-06-13 DIAGNOSIS — Z4803 Encounter for change or removal of drains: Secondary | ICD-10-CM

## 2018-06-13 DIAGNOSIS — J910 Malignant pleural effusion: Secondary | ICD-10-CM

## 2018-06-15 ENCOUNTER — Other Ambulatory Visit: Payer: Self-pay | Admitting: Physician Assistant

## 2018-06-15 DIAGNOSIS — C7800 Secondary malignant neoplasm of unspecified lung: Secondary | ICD-10-CM

## 2018-06-15 DIAGNOSIS — J910 Malignant pleural effusion: Secondary | ICD-10-CM

## 2018-06-15 DIAGNOSIS — C801 Malignant (primary) neoplasm, unspecified: Secondary | ICD-10-CM

## 2018-06-15 DIAGNOSIS — C782 Secondary malignant neoplasm of pleura: Secondary | ICD-10-CM

## 2018-06-15 DIAGNOSIS — Z85850 Personal history of malignant neoplasm of thyroid: Secondary | ICD-10-CM

## 2018-06-21 ENCOUNTER — Other Ambulatory Visit: Payer: Self-pay

## 2018-06-26 ENCOUNTER — Encounter: Payer: Self-pay | Admitting: Internal Medicine

## 2018-06-26 ENCOUNTER — Other Ambulatory Visit: Payer: Self-pay

## 2018-06-26 ENCOUNTER — Encounter: Payer: Medicare Other | Admitting: Internal Medicine

## 2018-06-26 DIAGNOSIS — T884XXA Failed or difficult intubation, initial encounter: Secondary | ICD-10-CM

## 2018-06-26 DIAGNOSIS — R933 Abnormal findings on diagnostic imaging of other parts of digestive tract: Secondary | ICD-10-CM

## 2018-06-26 DIAGNOSIS — C799 Secondary malignant neoplasm of unspecified site: Secondary | ICD-10-CM

## 2018-06-26 NOTE — Patient Instructions (Signed)
Nice to speak to you today.  As we discussed I need to see what the PET scan shows and what Dr. Hinton Rao thinks about that before setting up an endoscopy to look at the stomach.  I will contact her about my thoughts and plans and will be waiting to hear from her.  I will try to check on the PET scan myself also if I am able to see in the records - I think I can.  I appreciate the opportunity to care for you. Gatha Mayer, MD, [facility]

## 2018-06-26 NOTE — Progress Notes (Signed)
TELEHEALTH ENCOUNTER IN SETTING OF COVID-19 PANDEMIC - REQUESTED BY PATIENT SERVICE PROVIDED BY TELEMEDECINE PATIENT LOCATION: home PATIENT HAS CONSENTED TO TELEHEALTH VISIT PROVIDER LOCATION: OFFICE REFERRING PROVIDER:Dr. Hosie Poisson TIME SPENT ON CALL: 20 minutes    Haley [name] 78 y.o. Oct 25, 1940 [id number]  Assessment & Plan:   Encounter Diagnoses  Name Primary?  . Abnormal CT scan, stomach Yes  . 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 Difficult airway for intubation, initial encounter    Having PET scan - that may allow for dx of primary. If not can do an EGD but will need to be done at hospital due to hx difficult airway.  Will communicate with Dr. Hinton Rao re: I am waiting for PET results. I have spoken to Dr. Hinton Rao We will see what the PET scan shows.  Further plans pending that it may show the primary and obviate the need for an EGD.  If she had a stromal tumor, I do not think that is necessarily even worth pursuing at this point given the widely metastatic cancer she has.  06/29/2018  Message received and copy of PET scan report from Dr. 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  Dr. Hinton Rao will inform the patient. Subjective:   Chief Complaint: abnormal stomach on CT  HPI The patient is a 78 year old white woman with a history of thyroid cancer who has multiple pulmonary nodules left pleural effusion and a left pleural biopsy showing metastatic carcinoma.  CT scanning in late March showed the following listed below.  Abnormal appearance of the proximal posterior stomach. Although this could represent a gastric diverticulum, a cavitary exophytic lesion such as gastrointestinal stromal tumor could have a similar appearance (especially given adjacent gastrohepatic ligament adenopathy). Correlate with results of  thoracentesis. Consider PET and/or endoscopy.  Her pleural effusion has been treated with a Pleurx catheter.  That seems to be doing well right now.  The biopsies came back suggesting that this could be ovarian (removed), renal and possibly thyroid less likely after special stains were considered but the exact primary is not noted.  She is referred to me to be considered for an upper endoscopy in the setting of his abnormal stomach.  A PET scan has been ordered by her oncologist Dr. Hinton Rao, for 2 days from now. No dysphagia.  Uses over-the-counter H2 blocker was Zantac now Tagamet for some heartburn.  No bowel movement changes.  Negative colonoscopy about 5 years ago, Dr. Lyda Jester.  Never had an EGD.  Review of oncology records show she has a Ca1 25 a full 48 CA 19 9 of 94 CA 27-20 90105 and a normal CEA.  Transaminases 53 and 44.  Sodium 130 potassium 3 normal coags.  CBC normal. Allergies  Allergen Reactions  . Erythromycin Nausea And Vomiting  . Other Hives, Nausea And Vomiting and Other (See Comments)    Amaretto  . Shellfish Allergy Nausea And Vomiting  . Codeine Nausea And Vomiting  . Eggs Or Egg-Derived Products Nausea And Vomiting and Rash  . Fish Allergy Nausea And Vomiting and Rash  . Niacin And Related Rash  . Oregano [Origanum Oil] Nausea And Vomiting and Rash  . Tetanus Toxoids Rash and Other (See Comments)    High fever, body aches - if given she has to receive in 2 shots to prevent reaction  Current Outpatient Medications:  .  acetaminophen (TYLENOL) 500 MG tablet, Take 2 tablets (1,000 mg total) by mouth every 6 (six) hours as needed., Disp: 30 tablet, Rfl: 0 .  calcium carbonate (OS-CAL - DOSED IN MG OF ELEMENTAL CALCIUM) 1250 (500 Ca) MG tablet, Take 2 tablets (1,000 mg of elemental calcium total) by mouth 2 (two) times daily with a meal. (Patient not taking: Reported on 05/30/2018), Disp: 60 tablet, Rfl: 1 .  captopril (CAPOTEN) 25 MG tablet, Take 25 mg by mouth 3  (three) times daily., Disp: , Rfl: 3 .  Carboxymethylcellulose Sodium (REFRESH TEARS OP), Place 1-2 drops into both eyes as needed (For dry eyes.)., Disp: , Rfl:  .  cimetidine (TAGAMET) 200 MG tablet, Take 200 mg by mouth every morning., Disp: , Rfl:  .  fexofenadine (ALLEGRA) 180 MG tablet, Take 180 mg by mouth daily., Disp: , Rfl:  .  ibuprofen (ADVIL,MOTRIN) 200 MG tablet, Take 200 mg by mouth every 6 (six) hours as needed for moderate pain. , Disp: , Rfl:  .  ondansetron (ZOFRAN) 4 MG tablet, Take 1 tablet (4 mg total) by mouth every 8 (eight) hours as needed for nausea or vomiting., Disp: 90 tablet, Rfl: 1 .  rosuvastatin (CRESTOR) 20 MG tablet, Take 20 mg by mouth at bedtime. , Disp: , Rfl: 0 .  SYNTHROID 125 MCG tablet, Take 125 mcg by mouth See admin instructions. Take 125 mcg by mouth daily except Saturday take 62.5 mcg, Disp: , Rfl: 11 .  traMADol (ULTRAM) 50 MG tablet, TAKE 1 TO 2 TABLETS BY MOUTH EVERY 6 HOURS AS NEEDED FOR mild PAIN, Disp: 30 tablet, Rfl: 0 .  Vitamin D, Ergocalciferol, (DRISDOL) 50000 units CAPS capsule, Take 50,000 Units by mouth every Wednesday., Disp: , Rfl: 3  Past Medical History:  Diagnosis Date  . Allergic rhinitis    due to pollen  . Cancer ([place])    basal cell removal x 3  . Complication of anesthesia    hx difficult airway/ 2014, 2016  . Difficult intubation    02/16/11  . Dyspnea    with recent fluid build up  started 4 weeks ago today 06/05/2018  . GERD (gastroesophageal reflux disease)   . History of thyroid cancer    removed 2017  . Hyperlipidemia    mixed type  . Hypertension   . Osteopenia    Past Surgical History:  Procedure Laterality Date  . ABDOMINAL HYSTERECTOMY  1980  . CHEST TUBE INSERTION Left 06/07/2018   Procedure: INSERTION PLEURAL DRAINAGE CATHETER, left chest;  Surgeon: Melrose Nakayama, MD;  Location: [facility];  Service: Thoracic;  Laterality: Left;  . CHOLECYSTECTOMY  11/1984  . COLONOSCOPY  2014   Negative per patient  - [place]  . INNER EAR SURGERY Bilateral    2014, 2016  . PLEURAL EFFUSION DRAINAGE Left 06/07/2018   Procedure: DRAINAGE OF PLEURAL EFFUSION, Talc Pleurodesis;  Surgeon: Melrose Nakayama, MD;  Location: [facility];  Service: Thoracic;  Laterality: Left;  . THYROIDECTOMY N/A 08/28/2015   Procedure: TOTAL THYROIDECTOMY WITH LIMITED LYMPH NODE DISSECTION;  Surgeon: Armandina Gemma, MD;  Location: [facility];  Service: General;  Laterality: N/A;  . TONSILLECTOMY    . VIDEO ASSISTED THORACOSCOPY (VATS)/WEDGE RESECTION Left 06/07/2018   Procedure: VIDEO ASSISTED THORACOSCOPY (VATS), left lung;  Surgeon: Melrose Nakayama, MD;  Location: [facility];  Service: Thoracic;  Laterality: Left;   Social History   Social History Narrative   Married, 4 kids  No alcohol tobacco or drug use   family history includes Aneurysm in her mother; Heart attack in her father; Lung cancer in her sister.   Review of Systems See HPI.  In general she feels better with less dyspnea after she had her pleural effusions drained.  She is tolerating the Pleurx catheter.

## 2018-06-27 ENCOUNTER — Other Ambulatory Visit: Payer: Self-pay

## 2018-06-27 ENCOUNTER — Ambulatory Visit (INDEPENDENT_AMBULATORY_CARE_PROVIDER_SITE_OTHER): Payer: Self-pay | Admitting: Thoracic Surgery (Cardiothoracic Vascular Surgery)

## 2018-06-27 ENCOUNTER — Ambulatory Visit
Admission: RE | Admit: 2018-06-27 | Discharge: 2018-06-27 | Disposition: A | Discharge status: Home / Self Care | Payer: Medicare Other | Source: Ambulatory Visit | Attending: Physician Assistant | Admitting: Physician Assistant

## 2018-06-27 VITALS — BP 163/88 | HR 110 | Temp 98.6°F | Resp 20 | Ht 66.0 in | Wt 143.0 lb

## 2018-06-27 DIAGNOSIS — G8918 Other acute postprocedural pain: Secondary | ICD-10-CM

## 2018-06-27 DIAGNOSIS — J910 Malignant pleural effusion: Secondary | ICD-10-CM

## 2018-06-27 DIAGNOSIS — J90 Pleural effusion, not elsewhere classified: Secondary | ICD-10-CM

## 2018-06-27 MED ORDER — TRAMADOL HCL 50 MG PO TABS: 50.0000 mg | ORAL_TABLET | Freq: Four times a day (QID) | ORAL | 0 refills | Status: DC | PRN

## 2018-06-27 NOTE — Progress Notes (Signed)
[address]       [place] [address]             [phone]     HPI: Haley [name] returns for a scheduled follow-up visit  Haley [name] is a 78 year old woman with a past history of thyroid cancer, hypertension, hyperlipidemia, osteopenia, and reflux.  She is a non-smoker.  She presented with shortness of breath.  She was found to have a large left pleural effusion.  She had multiple thoracenteses draining over a liter of fluid each time.  All cytologies were negative.  I did a VATS for pleural biopsy and drainage of her pleural effusion on 06/07/2018.  I drained about 4 L of fluid.  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  I did a talc pleurodesis and also left a pleural catheter.  She drained up to 150 cc of fluid through the pleural catheter on the first few drainages but over the past couple of weeks her drainage has dropped off dramatically.  Her breathing has improved significantly since the procedure.  She does complain of 2 lumps on her back that are painful when she leans against them while sitting.  She has seen Dr. Hinton Rao.  She is scheduled for a PET/CT tomorrow.  Current Outpatient Medications  Medication Sig Dispense Refill  . acetaminophen (TYLENOL) 500 MG tablet Take 2 tablets (1,000 mg total) by mouth every 6 (six) hours as needed. 30 tablet 0  . calcium carbonate (OS-CAL - DOSED IN MG OF ELEMENTAL CALCIUM) 1250 (500 Ca) MG tablet Take 2 tablets (1,000 mg of elemental calcium total) by mouth 2 (two) times daily with a meal. 60 tablet 1  . captopril (CAPOTEN) 25 MG tablet Take 25 mg by mouth 3 (three) times daily.  3  . Carboxymethylcellulose Sodium (REFRESH TEARS OP) Place 1-2 drops into both eyes as needed (For dry eyes.).    Marland Kitchen cimetidine (TAGAMET) 200 MG tablet Take 200 mg by mouth every morning.    . fexofenadine (ALLEGRA) 180 MG tablet Take 180 mg by mouth daily.    Marland Kitchen ibuprofen (ADVIL,MOTRIN) 200 MG tablet Take 200 mg by mouth every 6  (six) hours as needed for moderate pain.     Marland Kitchen ondansetron (ZOFRAN) 4 MG tablet Take 1 tablet (4 mg total) by mouth every 8 (eight) hours as needed for nausea or vomiting. 90 tablet 1  . rosuvastatin (CRESTOR) 20 MG tablet Take 20 mg by mouth at bedtime.   0  . SYNTHROID 125 MCG tablet Take 125 mcg by mouth See admin instructions. Take 125 mcg by mouth daily except Saturday take 62.5 mcg  11  . traMADol (ULTRAM) 50 MG tablet TAKE 1 TO 2 TABLETS BY MOUTH EVERY 6 HOURS AS NEEDED FOR mild PAIN 30 tablet 0  . Vitamin D, Ergocalciferol, (DRISDOL) 50000 units CAPS capsule Take 50,000 Units by mouth every Wednesday.  3   No current facility-administered medications for this visit.     Physical Exam BP (!) 163/88   Pulse (!) 110   Temp 98.6 F (37 C) (Oral)   Resp 20   Ht 5\' 6"  (1.676 m)   Wt 143 lb (64.9 kg)   SpO2 95% Comment: RA  BMI 23.67 kg/m  78 year old woman in no acute distress Alert and oriented x3 with no focal deficits Lungs diminished at left base, otherwise clear Firm palpable nodules left posterior chest wall no erythema, mild tenderness  Diagnostic Tests: CHEST - 2  VIEW  COMPARISON:  June 12, 2018 chest radiograph and chest CT May 24, 2018  FINDINGS: There is a chest tube on the left without pneumothorax. There is effusion and consolidation throughout much of the left mid and lower lung zones. There is a minimal pleural effusion on the right. There are multiple subcentimeter pulmonary nodular lesions, better seen on recent CT. Heart size and pulmonary vascularity normal. No adenopathy is appreciable by radiography. Postoperative changes are noted in the left apex as well as in the region of the thyroid. No evident bone lesions.  IMPRESSION: Chest tube on the left without pneumothorax. Combination of effusion and airspace consolidation throughout much of the left mid lower lung zones. Small right pleural effusion. Subcentimeter pulmonary nodular lesions,  better seen on chest CT, likely indicative of metastatic foci.  Heart size within normal limits. No adenopathy demonstrable by radiography.   Electronically Signed   By: Lowella Grip III M.D.   On: 06/27/2018 13:44 I personally reviewed the chest x-ray images and concur with the findings noted above  Impression: [name] is a 78 year old woman who presented with shortness of breath and was found to have large left pleural effusion.  She had multiple thoracenteses but rapid recurrence each time.  I did a VATS for drainage of the effusion, pleural biopsies, and talc pleurodesis on 06/07/2018.  She has improved symptomatically.  She has had minimal drainage from pleural catheter.  She does have some residual loculated effusion posteriorly.  I will leave her catheter in just in case that fluid worsens to the point it reaches the catheter.  I suspect that is walled off by the pleurodesis.  We will repeat her chest x-ray and try to drain the catheter when she comes back in 2 weeks.  If the x-ray is stable and there is no drainage we will remove the catheter.  She has some tender nodules posteriorly.  These are not in the area of the incisions or the pleural catheter.  I suspect these are tumor.  She is scheduled to have a PET CT tomorrow hopefully that will provide Haley [name] with some more information.  Carcinoma of unknown origin-again hopefully the PET/CT tomorrow will provide some information as to the primary site.  She has seen Dr. Hinton Rao but has not started treatment pending the results of the PET/CT.  She is having some pain.  We will refill her tramadol prescription.  Plan: We will hold off on pleural catheter drainage for 2 weeks.  She will return in 2 weeks with a PA and lateral chest x-ray.  If there is no drainage from the catheter and her chest x-ray is stable we will remove the catheter at that visit.  Melrose Nakayama, MD [facility] [phone]

## 2018-06-29 DIAGNOSIS — Z85850 Personal history of malignant neoplasm of thyroid: Secondary | ICD-10-CM

## 2018-06-29 DIAGNOSIS — C7800 Secondary malignant neoplasm of unspecified lung: Secondary | ICD-10-CM

## 2018-06-29 DIAGNOSIS — C801 Malignant (primary) neoplasm, unspecified: Secondary | ICD-10-CM

## 2018-06-29 DIAGNOSIS — J910 Malignant pleural effusion: Secondary | ICD-10-CM | POA: Diagnosis not present

## 2018-06-29 DIAGNOSIS — C782 Secondary malignant neoplasm of pleura: Secondary | ICD-10-CM | POA: Diagnosis not present

## 2018-07-03 NOTE — Progress Notes (Signed)
This encounter was created in error - please disregard.

## 2018-07-10 ENCOUNTER — Other Ambulatory Visit (HOSPITAL_COMMUNITY)
Admission: RE | Admit: 2018-07-10 | Discharge: 2018-07-10 | Disposition: A | Discharge status: Home / Self Care | Payer: Medicare Other | Source: Ambulatory Visit | Attending: Oncology | Admitting: Oncology

## 2018-07-10 ENCOUNTER — Other Ambulatory Visit: Payer: Self-pay | Admitting: Thoracic Surgery (Cardiothoracic Vascular Surgery)

## 2018-07-10 DIAGNOSIS — J910 Malignant pleural effusion: Secondary | ICD-10-CM | POA: Diagnosis present

## 2018-07-10 DIAGNOSIS — C7800 Secondary malignant neoplasm of unspecified lung: Secondary | ICD-10-CM | POA: Insufficient documentation

## 2018-07-10 DIAGNOSIS — C782 Secondary malignant neoplasm of pleura: Secondary | ICD-10-CM | POA: Diagnosis not present

## 2018-07-10 DIAGNOSIS — Z85850 Personal history of malignant neoplasm of thyroid: Secondary | ICD-10-CM | POA: Insufficient documentation

## 2018-07-10 DIAGNOSIS — Z1509 Genetic susceptibility to other malignant neoplasm: Secondary | ICD-10-CM | POA: Insufficient documentation

## 2018-07-11 ENCOUNTER — Ambulatory Visit
Admission: RE | Admit: 2018-07-11 | Discharge: 2018-07-11 | Disposition: A | Discharge status: Home / Self Care | Payer: Medicare Other | Source: Ambulatory Visit | Attending: Thoracic Surgery (Cardiothoracic Vascular Surgery) | Admitting: Thoracic Surgery (Cardiothoracic Vascular Surgery)

## 2018-07-11 ENCOUNTER — Ambulatory Visit (INDEPENDENT_AMBULATORY_CARE_PROVIDER_SITE_OTHER): Payer: Self-pay | Admitting: Thoracic Surgery (Cardiothoracic Vascular Surgery)

## 2018-07-11 ENCOUNTER — Other Ambulatory Visit: Payer: Self-pay

## 2018-07-11 ENCOUNTER — Encounter: Payer: Self-pay | Admitting: Thoracic Surgery (Cardiothoracic Vascular Surgery)

## 2018-07-11 ENCOUNTER — Other Ambulatory Visit: Payer: Self-pay | Admitting: *Deleted

## 2018-07-11 VITALS — BP 152/83 | HR 112 | Temp 97.5°F | Resp 16 | Ht 66.0 in | Wt 143.0 lb

## 2018-07-11 DIAGNOSIS — J910 Malignant pleural effusion: Secondary | ICD-10-CM

## 2018-07-11 DIAGNOSIS — Z09 Encounter for follow-up examination after completed treatment for conditions other than malignant neoplasm: Secondary | ICD-10-CM

## 2018-07-11 DIAGNOSIS — G8918 Other acute postprocedural pain: Secondary | ICD-10-CM

## 2018-07-11 MED ORDER — TRAMADOL HCL 50 MG PO TABS: 50.0000 mg | ORAL_TABLET | Freq: Four times a day (QID) | ORAL | 0 refills | Status: AC | PRN

## 2018-07-11 NOTE — Progress Notes (Signed)
[address]       ,[place] [address]             [phone]     HPI: Mrs. Hodges returns for follow-up  Haley [name] is a 78 year old woman with a history of thyroid cancer, hypertension, hyperlipidemia, osteopenia, and reflux.  She is a lifelong non-smoker.  She presented with shortness of breath and was found to have a large pleural effusion.  She underwent VATS for pleural biopsy and drainage of her pleural effusion on 06/07/2018.  4 L of fluid was drained pleural biopsy showed carcinoma which now is felt to be thyroid in origin.  We did talc pleurodesis and left a pleural catheter in place.  I saw her in the office on 06/27/2018.  She was doing well at that point.  She was not having any significant shortness of breath.  Chest x-ray showed some loculated fluid posteriorly.  She was draining minimally from the pleural catheter.  In the interim since her last visit she had a PET CT which showed significant activity in the pleural space.  The loculated posterior left pleural effusion was unchanged.  She continues to do well and is not complaining of shortness of breath currently.  Past Medical History:  Diagnosis Date  . Allergic rhinitis    due to pollen  . Cancer ([place])    basal cell removal x 3  . Complication of anesthesia    hx difficult airway/ 2014, 2016  . Difficult intubation    02/16/11  . Dyspnea    with recent fluid build up  started 4 weeks ago today 06/05/2018  . GERD (gastroesophageal reflux disease)   . History of thyroid cancer    removed 2017  . Hyperlipidemia    mixed type  . Hypertension   . Osteopenia     Current Outpatient Medications  Medication Sig Dispense Refill  . acetaminophen (TYLENOL) 500 MG tablet Take 2 tablets (1,000 mg total) by mouth every 6 (six) hours as needed. 30 tablet 0  . calcium carbonate (OS-CAL - DOSED IN MG OF ELEMENTAL CALCIUM) 1250 (500 Ca) MG tablet Take 2 tablets (1,000 mg of elemental calcium total) by  mouth 2 (two) times daily with a meal. 60 tablet 1  . captopril (CAPOTEN) 25 MG tablet Take 25 mg by mouth 3 (three) times daily.  3  . Carboxymethylcellulose Sodium (REFRESH TEARS OP) Place 1-2 drops into both eyes as needed (For dry eyes.).    Marland Kitchen cimetidine (TAGAMET) 200 MG tablet Take 200 mg by mouth every morning.    . fexofenadine (ALLEGRA) 180 MG tablet Take 180 mg by mouth daily.    Marland Kitchen ibuprofen (ADVIL,MOTRIN) 200 MG tablet Take 200 mg by mouth every 6 (six) hours as needed for moderate pain.     Marland Kitchen ondansetron (ZOFRAN) 4 MG tablet Take 1 tablet (4 mg total) by mouth every 8 (eight) hours as needed for nausea or vomiting. 90 tablet 1  . rosuvastatin (CRESTOR) 20 MG tablet Take 20 mg by mouth at bedtime.   0  . SYNTHROID 125 MCG tablet Take 125 mcg by mouth See admin instructions. Take 125 mcg by mouth daily except Saturday take 62.5 mcg  11  . traMADol (ULTRAM) 50 MG tablet Take 1 tablet (50 mg total) by mouth every 6 (six) hours as needed. 28 tablet 0  . Vitamin D, Ergocalciferol, (DRISDOL) 50000 units CAPS capsule Take 50,000 Units by mouth every Wednesday.  3  No current facility-administered medications for this visit.     Physical Exam BP (!) 152/83 (BP Location: Right Arm, Patient Position: Sitting, Cuff Size: Normal)   Pulse (!) 112   Temp (!) 97.5 F (36.4 C) (Temporal)   Resp 16   Ht 5\' 6"  (1.676 m)   Wt 143 lb (64.9 kg)   SpO2 95% Comment: ON RA  BMI 23.08 kg/m   Diagnostic Tests: FINDINGS: Stable cardiomegaly. Stable position of left-sided pleural drainage catheter. No pneumothorax is noted. Right lung is clear. Stable left basilar opacity is noted consistent with atelectasis and possibly effusion. Bony thorax is unremarkable.  IMPRESSION: Stable position of left-sided pleural drainage catheter. Stable left basilar opacity is noted consistent with atelectasis and effusion.   Electronically Signed   By: Marijo Conception M.D.   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  She had VATS for drainage of the effusion, pleural biopsies, talc pleurodesis, and pleural catheter placement about a month ago.  She does still have a loculated effusion posteriorly but is not symptomatic from that.  It is not being drained by the pleural catheter.  I do not think further intervention is warranted in this setting unless she were to become symptomatic.  Her pleural catheter is not draining so we will plan to remove that in the office today.  Dr. Hinton Rao has decided on a treatment for her but they are awaiting insurance approval.  Plan: Remove pleural catheter Follow-up with Dr. Hinton Rao I will be happy to see Haley [name] back anytime in the future if I can be of any further assistance with her care  Melrose Nakayama, MD [facility] [phone]  Procedure Using sterile technique and 1% lidocaine local anesthetic (3 mL) pleural catheter was removed intact without difficulty.  Revonda Standard Roxan Hockey, MD [facility] [phone]

## 2018-07-18 ENCOUNTER — Encounter (HOSPITAL_COMMUNITY): Payer: Self-pay | Admitting: Oncology

## 2018-07-19 ENCOUNTER — Encounter (HOSPITAL_COMMUNITY): Payer: Self-pay | Admitting: Oncology

## 2018-07-24 LAB — BLOOD GAS, ARTERIAL

## 2018-07-28 ENCOUNTER — Encounter: Payer: Self-pay | Admitting: Thoracic Surgery (Cardiothoracic Vascular Surgery)

## 2018-07-29 DIAGNOSIS — G459 Transient cerebral ischemic attack, unspecified: Secondary | ICD-10-CM

## 2018-07-29 DIAGNOSIS — I361 Nonrheumatic tricuspid (valve) insufficiency: Secondary | ICD-10-CM | POA: Diagnosis not present

## 2018-07-29 DIAGNOSIS — E785 Hyperlipidemia, unspecified: Secondary | ICD-10-CM

## 2018-07-29 DIAGNOSIS — R4701 Aphasia: Secondary | ICD-10-CM

## 2018-07-29 DIAGNOSIS — I10 Essential (primary) hypertension: Secondary | ICD-10-CM | POA: Diagnosis not present

## 2018-07-29 DIAGNOSIS — C73 Malignant neoplasm of thyroid gland: Secondary | ICD-10-CM

## 2018-07-30 DIAGNOSIS — C73 Malignant neoplasm of thyroid gland: Secondary | ICD-10-CM | POA: Diagnosis not present

## 2018-07-30 DIAGNOSIS — G459 Transient cerebral ischemic attack, unspecified: Secondary | ICD-10-CM | POA: Diagnosis not present

## 2018-07-30 DIAGNOSIS — E785 Hyperlipidemia, unspecified: Secondary | ICD-10-CM | POA: Diagnosis not present

## 2018-07-30 DIAGNOSIS — I10 Essential (primary) hypertension: Secondary | ICD-10-CM | POA: Diagnosis not present

## 2018-07-31 ENCOUNTER — Other Ambulatory Visit: Payer: Self-pay | Admitting: Thoracic Surgery (Cardiothoracic Vascular Surgery)

## 2018-07-31 DIAGNOSIS — J910 Malignant pleural effusion: Secondary | ICD-10-CM

## 2018-08-01 ENCOUNTER — Ambulatory Visit (INDEPENDENT_AMBULATORY_CARE_PROVIDER_SITE_OTHER): Payer: Medicare Other | Admitting: Thoracic Surgery (Cardiothoracic Vascular Surgery)

## 2018-08-01 ENCOUNTER — Other Ambulatory Visit: Payer: Self-pay | Admitting: *Deleted

## 2018-08-01 ENCOUNTER — Ambulatory Visit
Admission: RE | Admit: 2018-08-01 | Discharge: 2018-08-01 | Disposition: A | Discharge status: Home / Self Care | Payer: Medicare Other | Source: Ambulatory Visit | Attending: Thoracic Surgery (Cardiothoracic Vascular Surgery) | Admitting: Thoracic Surgery (Cardiothoracic Vascular Surgery)

## 2018-08-01 ENCOUNTER — Other Ambulatory Visit: Payer: Self-pay

## 2018-08-01 ENCOUNTER — Encounter: Payer: Self-pay | Admitting: Thoracic Surgery (Cardiothoracic Vascular Surgery)

## 2018-08-01 VITALS — BP 116/66 | HR 90 | Temp 97.0°F | Resp 18 | Ht 65.5 in | Wt 133.0 lb

## 2018-08-01 DIAGNOSIS — Z5189 Encounter for other specified aftercare: Secondary | ICD-10-CM

## 2018-08-01 DIAGNOSIS — J910 Malignant pleural effusion: Secondary | ICD-10-CM

## 2018-08-01 NOTE — Progress Notes (Signed)
Haley [name]       Haley [name],Haley [name] [id number]             [phone]     HPI: Haley [name] returns for a follow-up regarding her pleural effusion.  Haley [name] is a very sweet 78 year old woman with a history of thyroid cancer, hypertension, hyperlipidemia, osteopenia, and reflux.  She presented with shortness of breath and was found to have a large left pleural effusion.  I did a VATS for pleural biopsy and drainage of the effusion.  I left a pleural catheter in place and also did a talc pleurodesis.  Biopsies showed metastatic thyroid cancer.  Her pleural catheter had minimal drainage.  Her chest x-ray did show some loculated fluid posteriorly.  I remove the pleural catheter on 07/11/2018 as it was not functional.  In the interim she started chemotherapy.  That stopped that after about 9 days.  She had drainage from the pleural catheter site initially for a few days but it stopped.  Then she started having drainage from both the exit site and the other incision after she started chemotherapy.  She was started on some antibiotics last week and now returns for an evaluation.  She says she is having to change the dressing on the wounds about 4-5 times a day.  She is getting some irritation from the tape.  Her respiratory status remained stable.  Past Medical History:  Diagnosis Date  . Allergic rhinitis    due to pollen  . Cancer ([place])    basal cell removal x 3  . Complication of anesthesia    hx difficult airway/ 2014, 2016  . Difficult intubation    02/16/11  . Dyspnea    with recent fluid build up  started 4 weeks ago today 06/05/2018  . GERD (gastroesophageal reflux disease)   . History of thyroid cancer    removed 2017  . Hyperlipidemia    mixed type  . Hypertension   . Osteopenia     Current Outpatient Medications  Medication Sig Dispense Refill  . acetaminophen (TYLENOL) 500 MG tablet Take 2 tablets (1,000 mg total) by mouth every 6 (six) hours as needed.  30 tablet 0  . aspirin EC 81 MG tablet Take 81 mg by mouth daily.    . captopril (CAPOTEN) 25 MG tablet Take 25 mg by mouth 3 (three) times daily.  3  . Carboxymethylcellulose Sodium (REFRESH TEARS OP) Place 1-2 drops into both eyes as needed (For dry eyes.).    Marland Kitchen cimetidine (TAGAMET) 200 MG tablet Take 200 mg by mouth every morning.    . ciprofloxacin (CIPRO) 500 MG tablet Take 500 mg by mouth 2 (two) times a day.    . ibuprofen (ADVIL,MOTRIN) 200 MG tablet Take 200 mg by mouth every 6 (six) hours as needed for moderate pain.     . metoprolol tartrate (LOPRESSOR) 25 MG tablet Take 25 mg by mouth 2 (two) times a day.    . ondansetron (ZOFRAN) 4 MG tablet Take 1 tablet (4 mg total) by mouth every 8 (eight) hours as needed for nausea or vomiting. 90 tablet 1  . rosuvastatin (CRESTOR) 20 MG tablet Take 20 mg by mouth at bedtime.   0  . SYNTHROID 125 MCG tablet Take 125 mcg by mouth See admin instructions. Take 125 mcg by mouth daily except Saturday take 62.5 mcg  11  . traMADol (ULTRAM) 50 MG tablet Take 50-100 mg by mouth every 6 (  six) hours as needed. for pain    . Vitamin D, Ergocalciferol, (DRISDOL) 50000 units CAPS capsule Take 50,000 Units by mouth every Wednesday.  3   No current facility-administered medications for this visit.     Physical Exam BP 116/66   Pulse 90   Temp (!) 97 F (36.1 C)   Resp 18   Ht 5' 5.5" (1.664 m)   Wt 133 lb (60.3 kg)   SpO2 92% Comment: on RA  BMI 21.90 kg/m  78 year old woman in no acute distress Alert and oriented x3 with no focal deficits Lungs diminished left posteriorly Serous drainage from exit and entry incisions left chest, no erythema Anterior site is a proximal 3 mm long and 1 mm wide and posterior site is approximately 8 mm long and 1 mm wide, depth is essentially 5 mm  Diagnostic Tests: CHEST - 2 VIEW  COMPARISON:  Chest x-ray dated July 11, 2018.  FINDINGS: Interval removal of the left-sided chest tube. The heart size and  mediastinal contours are within normal limits. Normal pulmonary vascularity. Unchanged loculated left pleural effusion posteriorly. Slightly improved aeration at the left lung base. Innumerable small nodules throughout both lungs are similar to prior study. No pneumothorax.  IMPRESSION: 1. Unchanged loculated left pleural effusion posteriorly. 2. Unchanged diffuse pulmonary metastatic disease.   Electronically Signed   By: Titus Dubin M.D.   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  She had a VATS with talc pleurodesis and pleural catheter placement on 06/07/2018.  She had some drainage from the pleural catheter initially but it dropped off rapidly despite having some loculated effusion posteriorly.  We removed the catheter about 3 or 4 weeks ago.  She had some drainage initially which resolved in a couple of days however when she started chemotherapy she started having large volumes of drainage from the sites.  That may have actually happened with or without chemotherapy.  I think it is just her malignant effusion finding his way to the outside.  I do not see any evidence of infection.  The question is how to best manage this.  Options would include trying to place a new pleural catheter.  That would be difficult but not impossible.  A second option would be surgical which I do not think is a good idea given her previous operation and overall medical condition.  Third option would be to continue with dressing changes.  I will think that is a good option either as she is having to change her dressing multiple times a day and her skin is going to get torn up pretty badly.  I think the best option would be to place a wound VAC over the site to keep the area drained.  I think that may also facilitate  healing of the areas as well.  We will arrange that through her home health nursing company.  She will complete a 7-day course of antibiotics.  Plan: Wound VAC Return in 3 weeks with PA and lateral chest x-ray  Melrose Nakayama, MD [facility] [phone]

## 2018-08-04 DIAGNOSIS — J910 Malignant pleural effusion: Secondary | ICD-10-CM

## 2018-08-04 DIAGNOSIS — C7800 Secondary malignant neoplasm of unspecified lung: Secondary | ICD-10-CM

## 2018-08-14 DIAGNOSIS — J910 Malignant pleural effusion: Secondary | ICD-10-CM | POA: Diagnosis not present

## 2018-08-22 ENCOUNTER — Other Ambulatory Visit: Payer: Self-pay | Admitting: *Deleted

## 2018-08-22 ENCOUNTER — Other Ambulatory Visit: Payer: Self-pay

## 2018-08-22 ENCOUNTER — Encounter: Payer: Self-pay | Admitting: Thoracic Surgery (Cardiothoracic Vascular Surgery)

## 2018-08-22 ENCOUNTER — Ambulatory Visit (INDEPENDENT_AMBULATORY_CARE_PROVIDER_SITE_OTHER): Payer: Self-pay | Admitting: Thoracic Surgery (Cardiothoracic Vascular Surgery)

## 2018-08-22 ENCOUNTER — Ambulatory Visit
Admission: RE | Admit: 2018-08-22 | Discharge: 2018-08-22 | Disposition: A | Discharge status: Home / Self Care | Payer: Medicare Other | Source: Ambulatory Visit | Attending: Thoracic Surgery (Cardiothoracic Vascular Surgery) | Admitting: Thoracic Surgery (Cardiothoracic Vascular Surgery)

## 2018-08-22 VITALS — BP 134/87 | HR 103 | Temp 95.2°F | Resp 20 | Ht 66.0 in | Wt 143.0 lb

## 2018-08-22 DIAGNOSIS — J90 Pleural effusion, not elsewhere classified: Secondary | ICD-10-CM

## 2018-08-22 DIAGNOSIS — J910 Malignant pleural effusion: Secondary | ICD-10-CM

## 2018-08-22 DIAGNOSIS — Z09 Encounter for follow-up examination after completed treatment for conditions other than malignant neoplasm: Secondary | ICD-10-CM

## 2018-08-22 NOTE — Progress Notes (Signed)
[address]       [place] [address]             [phone]     HPI: [name] returns for a scheduled follow-up  Haley [name] is a 78 year old woman with a history of thyroid cancer, hypertension, hyperlipidemia, osteopenia, and reflux.  She now has stage IV thyroid cancer.  She presented with shortness of breath and was found to have a large left pleural effusion.  I did a left VATS for pleural biopsies and drain the effusion I did talk pleurodesis and left a pleural catheter in place.  Biopsy showed metastatic thyroid cancer.  Her catheter had minimal drainage but she did have some residual loculated effusion posteriorly.  We really were draining nothing from the catheter so I removed it on April 28.  She then started chemotherapy.  She had some type of reaction to the first dose of chemotherapy and then started having significant drainage from the exit site.  I saw her in the office a couple of weeks ago and we placed a VAC over the site.  She noted significant drainage for about the first week and then it dropped off and she really has not had any drainage at all over the past week with the [facility] in place.  She feels very short of breath and complains she does not have any energy.  Past Medical History:  Diagnosis Date  . Allergic rhinitis    due to pollen  . Cancer ([place])    basal cell removal x 3  . Complication of anesthesia    hx difficult airway/ 2014, 2016  . Difficult intubation    02/16/11  . Dyspnea    with recent fluid build up  started 4 weeks ago today 06/05/2018  . GERD (gastroesophageal reflux disease)   . History of thyroid cancer    removed 2017  . Hyperlipidemia    mixed type  . Hypertension   . Osteopenia     Current Outpatient Medications  Medication Sig Dispense Refill  . acetaminophen (TYLENOL) 500 MG tablet Take 2 tablets (1,000 mg total) by mouth every 6 (six) hours as needed. 30 tablet 0  . aspirin EC 81 MG tablet Take 81 mg  by mouth daily.    . captopril (CAPOTEN) 25 MG tablet Take 25 mg by mouth 3 (three) times daily.  3  . Carboxymethylcellulose Sodium (REFRESH TEARS OP) Place 1-2 drops into both eyes as needed (For dry eyes.).    Marland Kitchen cimetidine (TAGAMET) 200 MG tablet Take 200 mg by mouth every morning.    . ciprofloxacin (CIPRO) 500 MG tablet Take 500 mg by mouth 2 (two) times a day.    . ibuprofen (ADVIL,MOTRIN) 200 MG tablet Take 200 mg by mouth every 6 (six) hours as needed for moderate pain.     . metoprolol tartrate (LOPRESSOR) 25 MG tablet Take 25 mg by mouth 2 (two) times a day.    . ondansetron (ZOFRAN) 4 MG tablet Take 1 tablet (4 mg total) by mouth every 8 (eight) hours as needed for nausea or vomiting. 90 tablet 1  . rosuvastatin (CRESTOR) 20 MG tablet Take 20 mg by mouth at bedtime.   0  . SYNTHROID 125 MCG tablet Take 125 mcg by mouth See admin instructions. Take 125 mcg by mouth daily except Saturday take 62.5 mcg  11  . traMADol (ULTRAM) 50 MG tablet Take 50-100 mg by mouth every 6 (six)  hours as needed. for pain    . Vitamin D, Ergocalciferol, (DRISDOL) 50000 units CAPS capsule Take 50,000 Units by mouth every Wednesday.  3   No current facility-administered medications for this visit.     Physical Exam BP 134/87 (BP Location: Left Arm, Patient Position: Sitting, Cuff Size: Normal)   Pulse (!) 103   Temp (!) 95.2 F (35.1 C) (Skin)   Resp 20   Ht 5\' 6"  (1.676 m)   Wt 143 lb (64.9 kg)   SpO2 92% Comment: ON RA  BMI 23.22 kg/m  78 year old woman, ill-appearing Diminished breath sounds left base posteriorly Incisions appear well-healed, minimal erythema from adhesive but no signs of infection  Diagnostic Tests: CHEST - 2 VIEW  COMPARISON:  08/01/2018  FINDINGS: Widespread small pulmonary nodules have progressed in the interval. Left lower lobe density and left pleural effusion have progressed. Small right pleural effusion has developed since the prior study.  Surgical clips in  the region of the thyroid. Atherosclerotic aortic arch.  IMPRESSION: Progression of widespread pulmonary metastasis with numerous pulmonary nodules. Progression of left lower lobe density and left effusion. Small right pleural effusion is new.   Electronically Signed   By: Franchot Gallo M.D.   On: 08/22/2018 11:20 I personally reviewed the chest x-ray images and concur with the findings noted above  Impression: Haley [name] is a 78 year old woman with stage IV thyroid cancer with a malignant left pleural effusion.  She had valves  Will attempt ultrasound-guided thoracentesis.  If she has significant clinical improvement with that we can discuss trying to place a new pleural catheter posteriorly.  I did a left VATS to drain the effusion and do pleural biopsies and placed a pleural catheter earlier this spring.  The pleural catheter essentially got sealed off and we remove that at the end of April.  She still has some loculated fluid posteriorly.  She was draining from her incisions.  With the [facility] that has stopped and she has not had any drainage in the past week.  There is no sign of infection.  On her chest x-ray I think the effusion is essentially unchanged but she has had significant progression of her metastatic disease in the lungs with multiple small nodules bilaterally.    I discussed the potential options with [name] and her husband.  I recommended we try a thoracentesis.  If she gets symptomatic relief from that it would be great.  And then if the effusion were to recur we can try to put another pleural catheter posteriorly.  There really is not a whole lot else to offer at this point.  Plan Ultrasound-guided thoracentesis Return in 4 weeks with PA and lateral chest x-ray  Melrose Nakayama, MD [facility] [phone]

## 2018-08-23 ENCOUNTER — Inpatient Hospital Stay (HOSPITAL_COMMUNITY): Admission: RE | Admit: 2018-08-23 | Payer: Medicare Other | Source: Ambulatory Visit

## 2018-08-25 DIAGNOSIS — J90 Pleural effusion, not elsewhere classified: Secondary | ICD-10-CM | POA: Diagnosis not present

## 2018-08-25 DIAGNOSIS — E785 Hyperlipidemia, unspecified: Secondary | ICD-10-CM | POA: Diagnosis not present

## 2018-08-25 DIAGNOSIS — I10 Essential (primary) hypertension: Secondary | ICD-10-CM

## 2018-08-25 DIAGNOSIS — C7800 Secondary malignant neoplasm of unspecified lung: Secondary | ICD-10-CM | POA: Diagnosis not present

## 2018-08-26 ENCOUNTER — Inpatient Hospital Stay (HOSPITAL_COMMUNITY): Admission: RE | Admit: 2018-08-26 | Payer: Medicare Other | Source: Ambulatory Visit

## 2018-08-26 DIAGNOSIS — C7800 Secondary malignant neoplasm of unspecified lung: Secondary | ICD-10-CM | POA: Diagnosis not present

## 2018-08-26 DIAGNOSIS — J90 Pleural effusion, not elsewhere classified: Secondary | ICD-10-CM | POA: Diagnosis not present

## 2018-08-26 DIAGNOSIS — J9601 Acute respiratory failure with hypoxia: Secondary | ICD-10-CM

## 2018-08-26 DIAGNOSIS — E785 Hyperlipidemia, unspecified: Secondary | ICD-10-CM | POA: Diagnosis not present

## 2018-08-26 DIAGNOSIS — I10 Essential (primary) hypertension: Secondary | ICD-10-CM | POA: Diagnosis not present

## 2018-08-29 ENCOUNTER — Ambulatory Visit (HOSPITAL_COMMUNITY): Admission: RE | Admit: 2018-08-29 | Payer: Medicare Other | Source: Ambulatory Visit

## 2018-09-13 DEATH — deceased

## 2018-09-19 ENCOUNTER — Encounter: Payer: Medicare Other | Admitting: Thoracic Surgery (Cardiothoracic Vascular Surgery)

## 2019-06-05 IMAGING — CR CHEST - 2 VIEW
2 series · 2 of 2 positions shown · non-contrast
Comparison: June 12, 2018 chest radiograph and chest CT May 24, 2018

CLINICAL DATA: Shortness of breath.  Previous VATS procedure.

EXAM:
CHEST - 2 VIEW

[w chest pa]
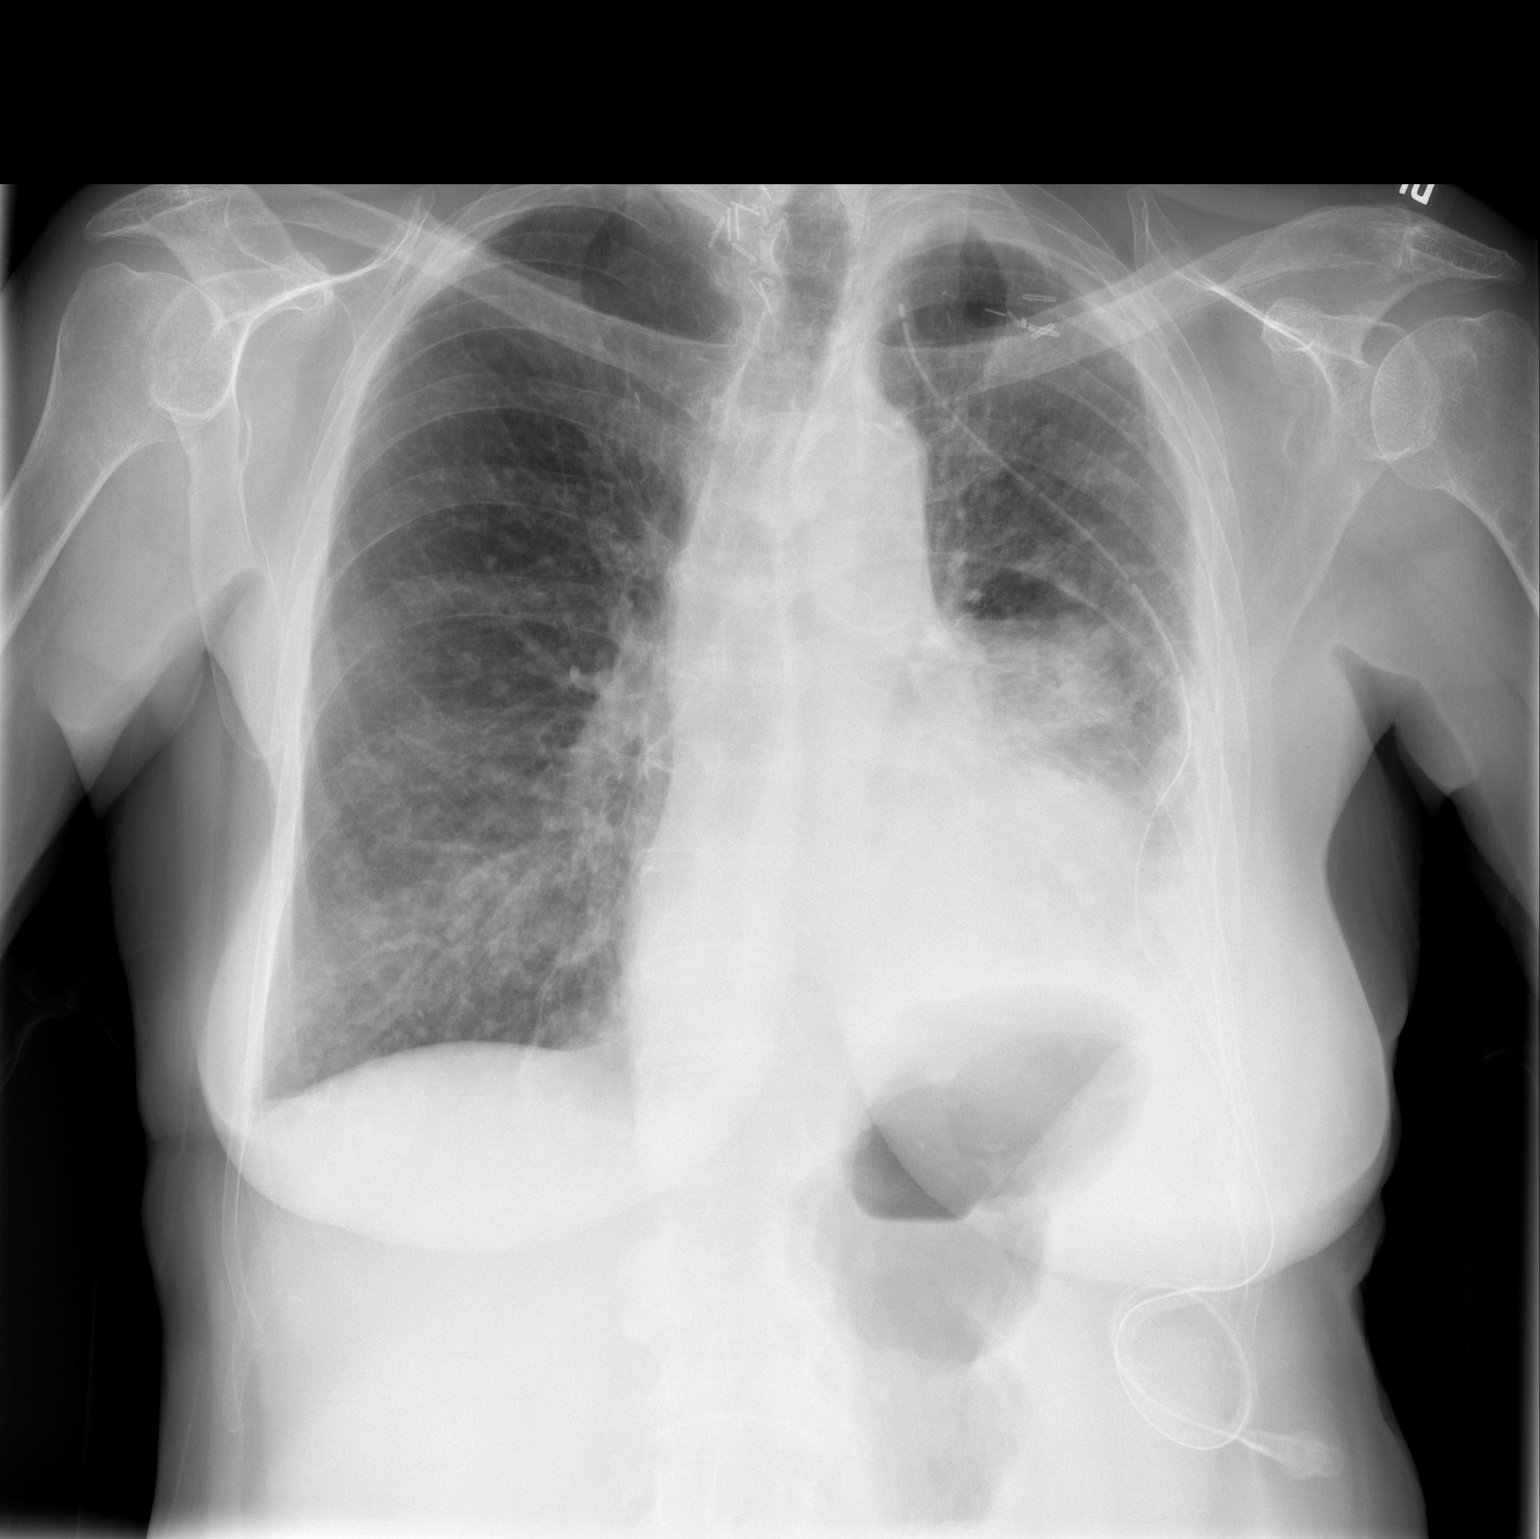

[w chest lat]
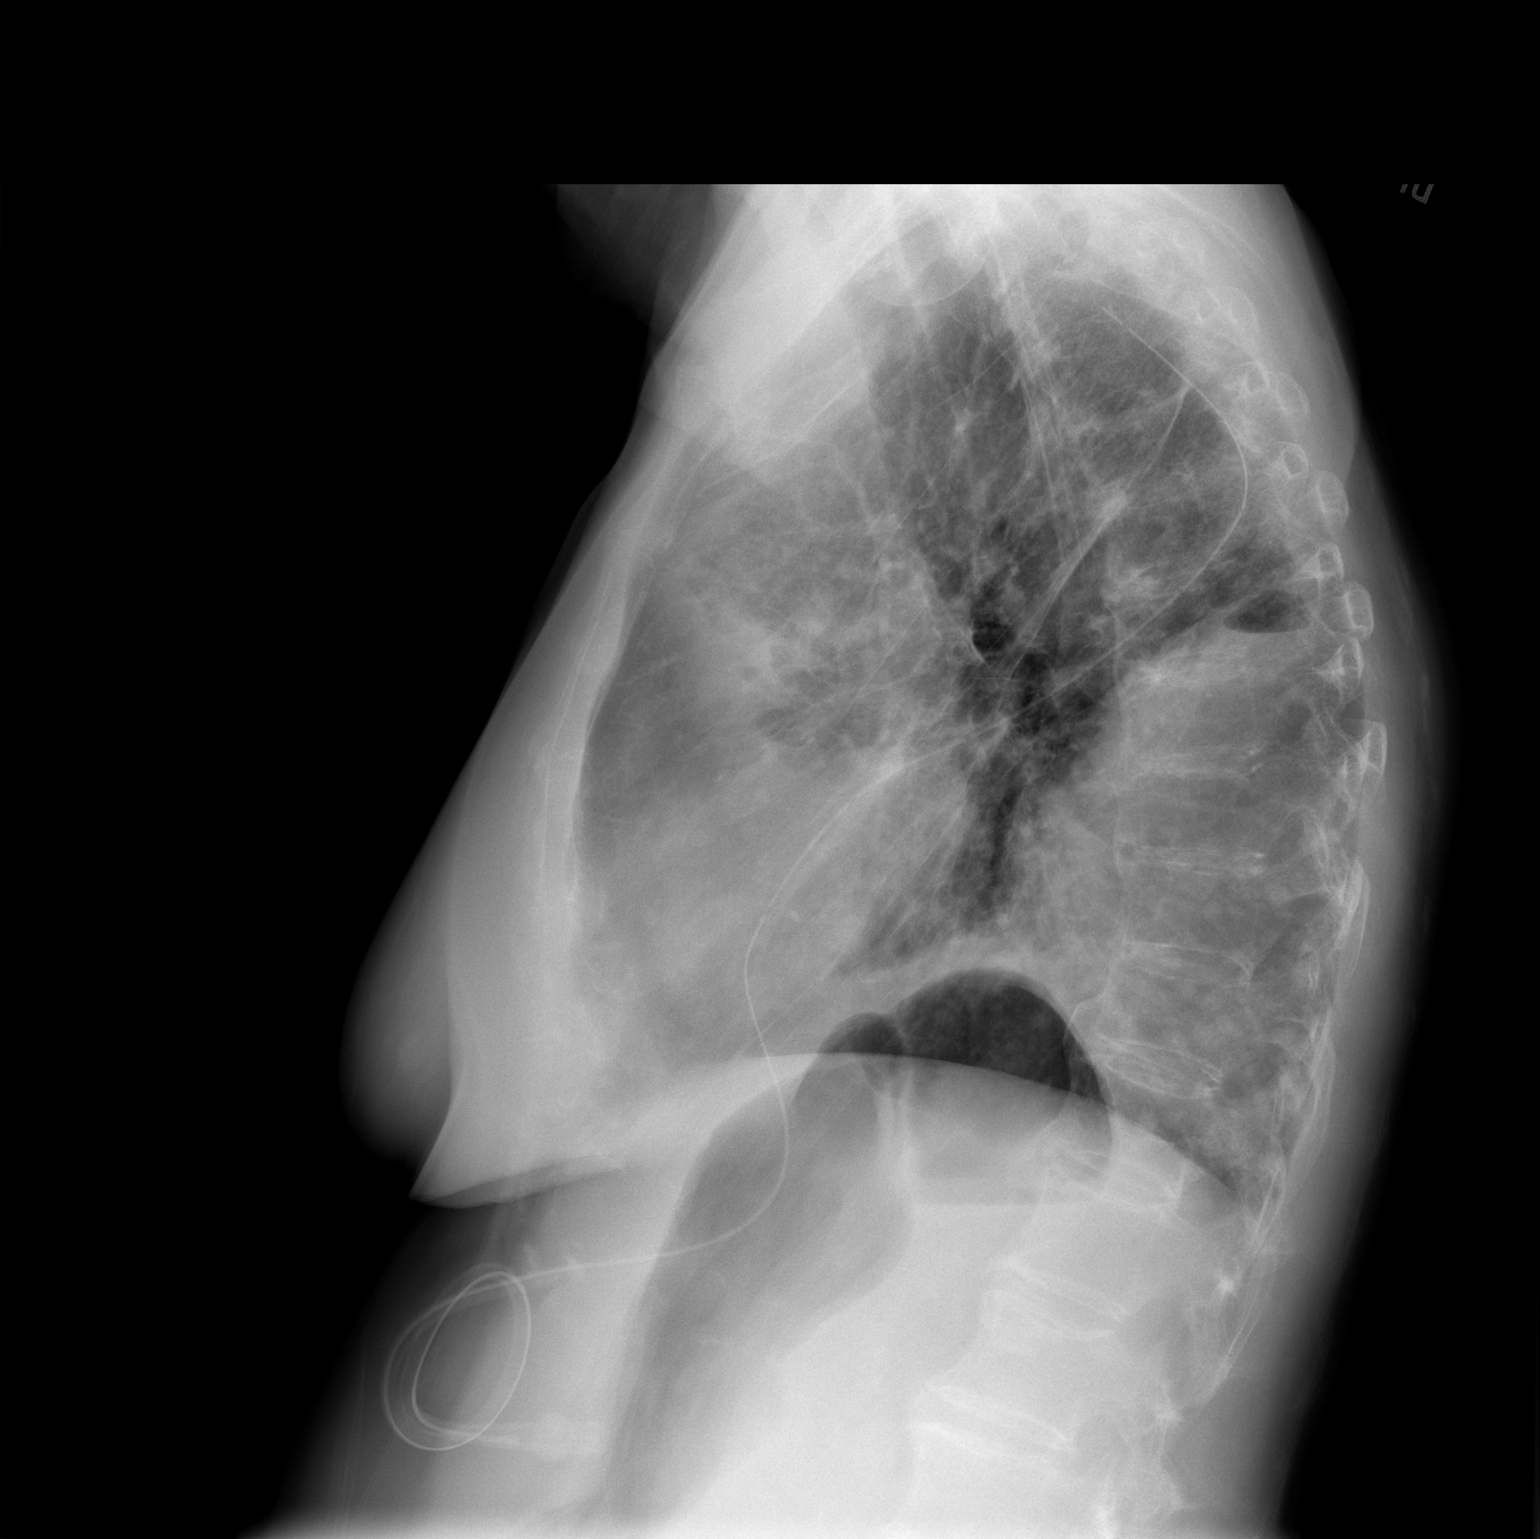

[2 of 2 positions shown; findings below may reference images not displayed]

FINDINGS: There is a chest tube on the left without pneumothorax. There is
effusion and consolidation throughout much of the left mid and lower
lung zones. There is a minimal pleural effusion on the right. There
are multiple subcentimeter pulmonary nodular lesions, better seen on
recent CT. Heart size and pulmonary vascularity normal. No
adenopathy is appreciable by radiography. Postoperative changes are
noted in the left apex as well as in the region of the thyroid. No
evident bone lesions.
IMPRESSION: Chest tube on the left without pneumothorax. Combination of effusion
and airspace consolidation throughout much of the left mid lower
lung zones. Small right pleural effusion. Subcentimeter pulmonary
nodular lesions, better seen on chest CT, likely indicative of
metastatic foci.

Heart size within normal limits. No adenopathy demonstrable by
radiography.

## 2019-07-10 IMAGING — DX CHEST - 2 VIEW
2 series · 2 of 2 positions shown · non-contrast
Comparison: Chest x-ray dated July 11, 2018.

CLINICAL DATA: Malignant left pleural effusion.

EXAM:
CHEST - 2 VIEW

[dg chest 2 view (1 of 2)]
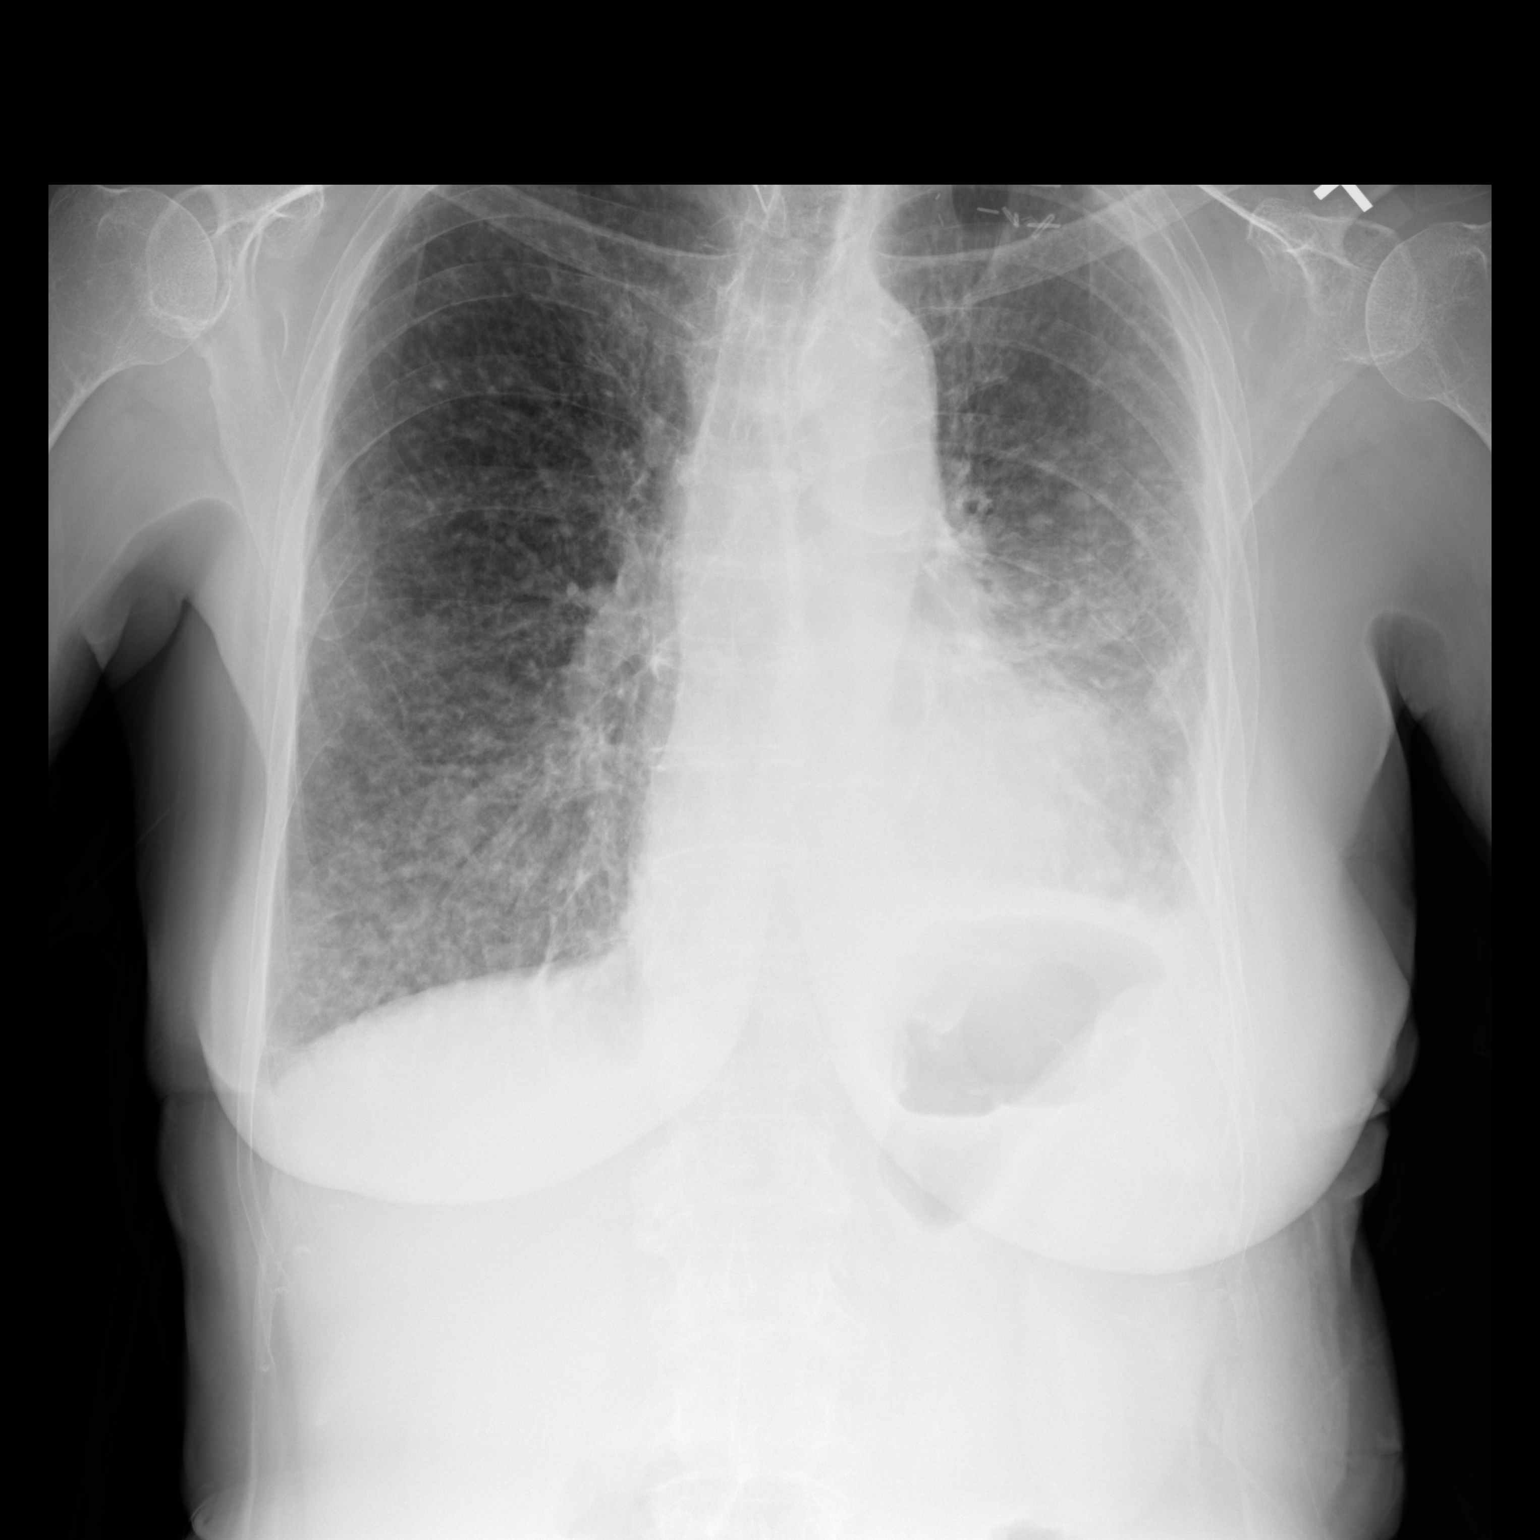

[dg chest 2 view (2 of 2)]
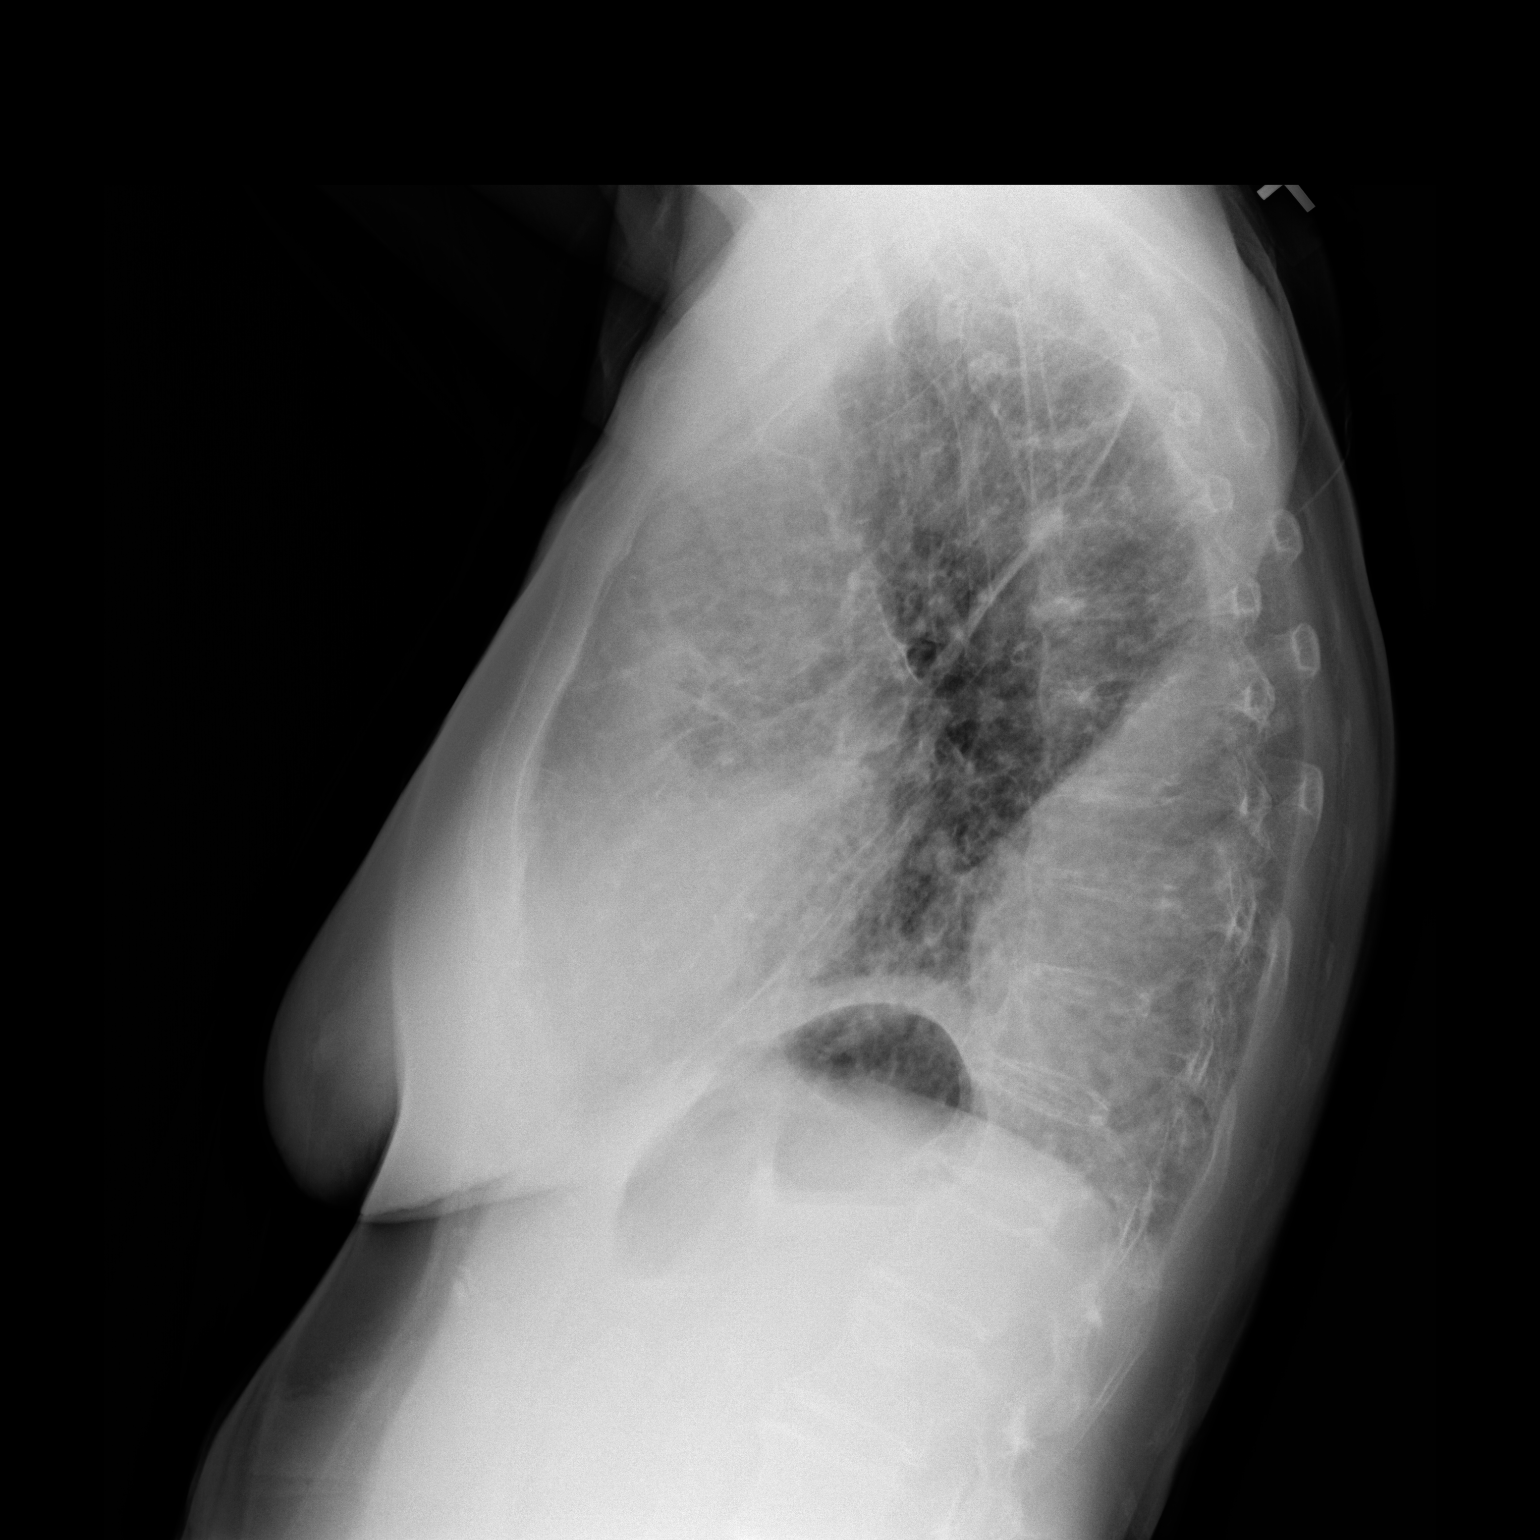

[2 of 2 positions shown; findings below may reference images not displayed]

FINDINGS: Interval removal of the left-sided chest tube. The heart size and
mediastinal contours are within normal limits. Normal pulmonary
vascularity. Unchanged loculated left pleural effusion posteriorly.
Slightly improved aeration at the left lung base. Innumerable small
nodules throughout both lungs are similar to prior study. No
pneumothorax.
IMPRESSION: 1. Unchanged loculated left pleural effusion posteriorly.
2. Unchanged diffuse pulmonary metastatic disease.

## 2019-07-31 IMAGING — CR CHEST - 2 VIEW
2 series · 2 of 2 positions shown · non-contrast
Comparison: 08/01/2018

CLINICAL DATA: Vats 06/07/2018.  Malignant pleural effusion.

EXAM:
CHEST - 2 VIEW

[w chest pa]
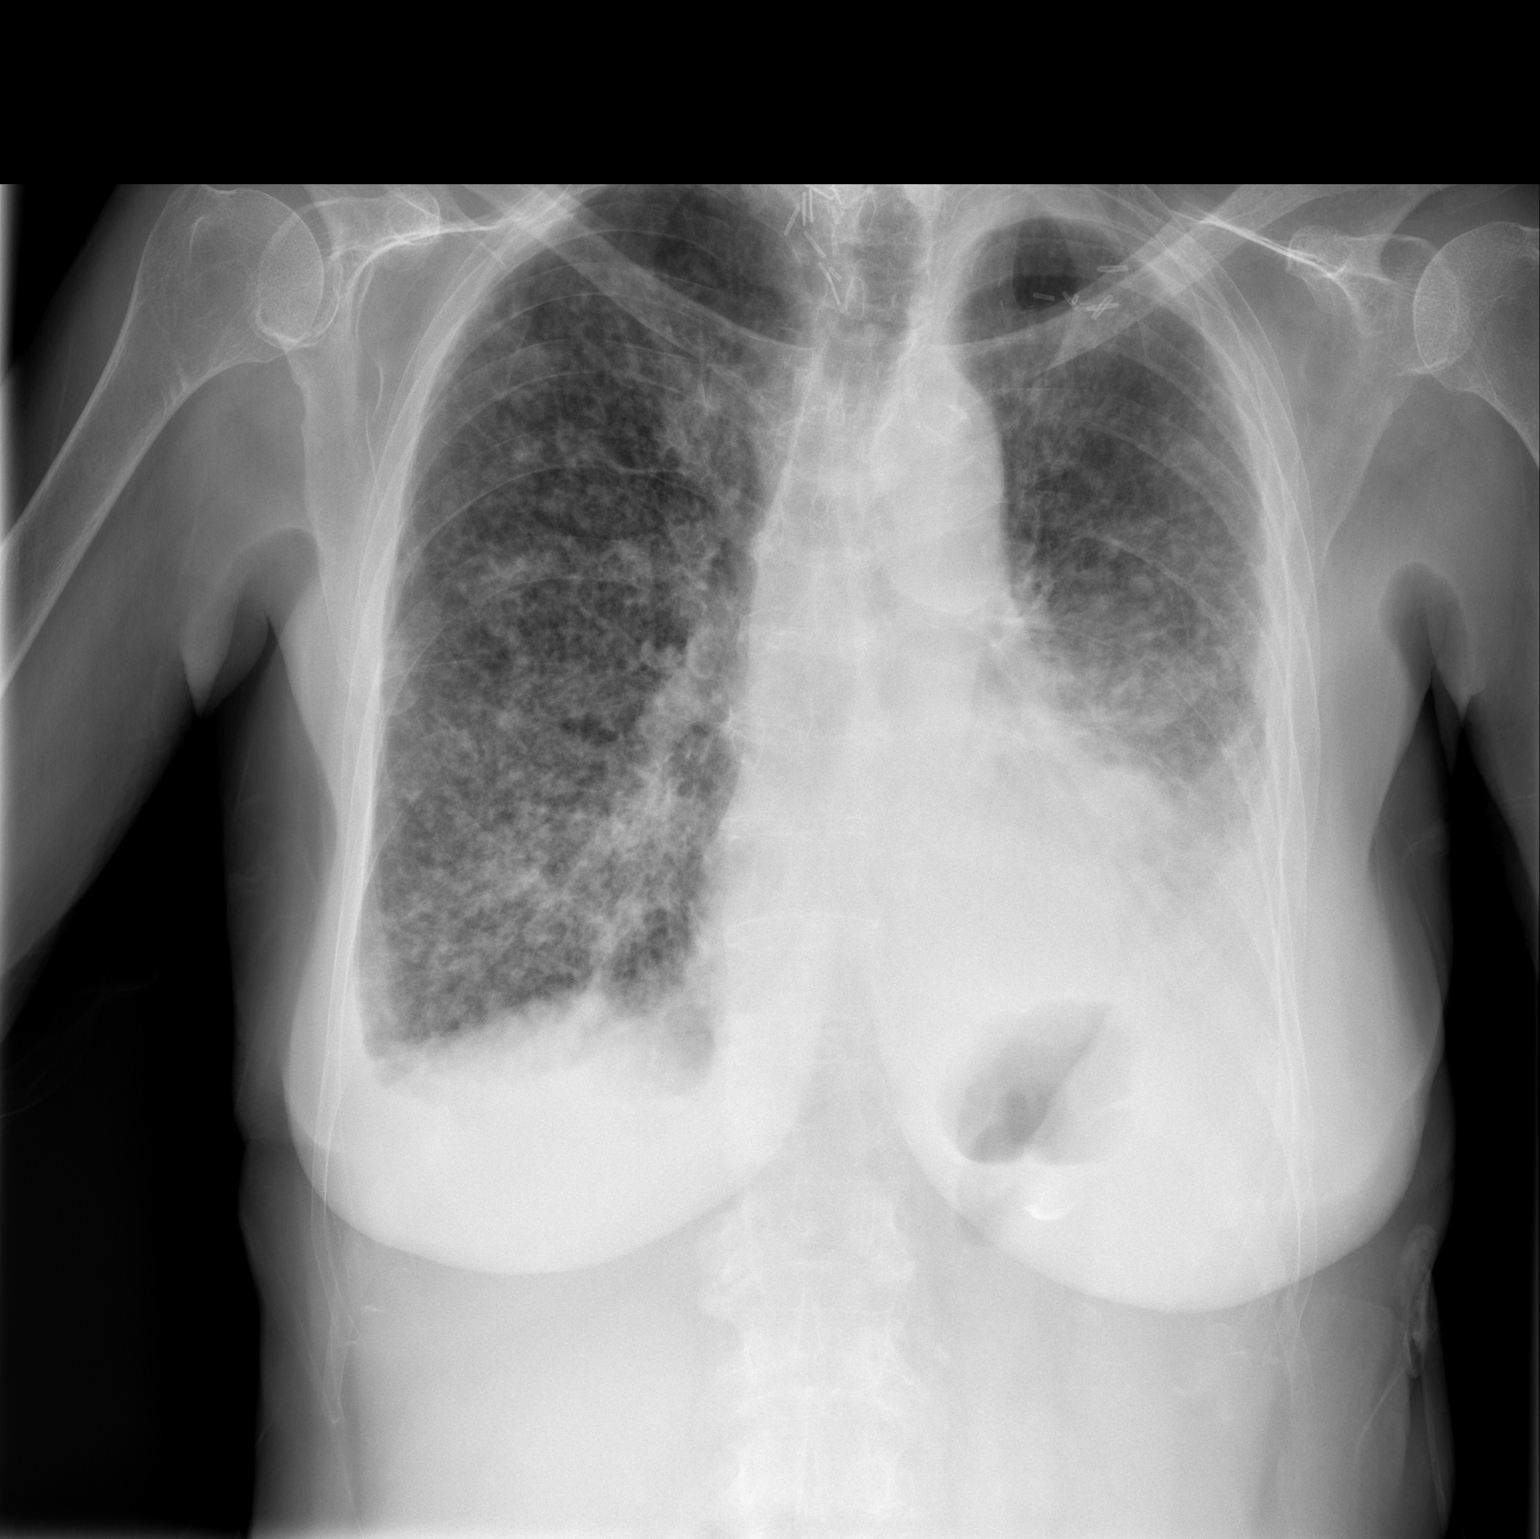

[w chest lat]
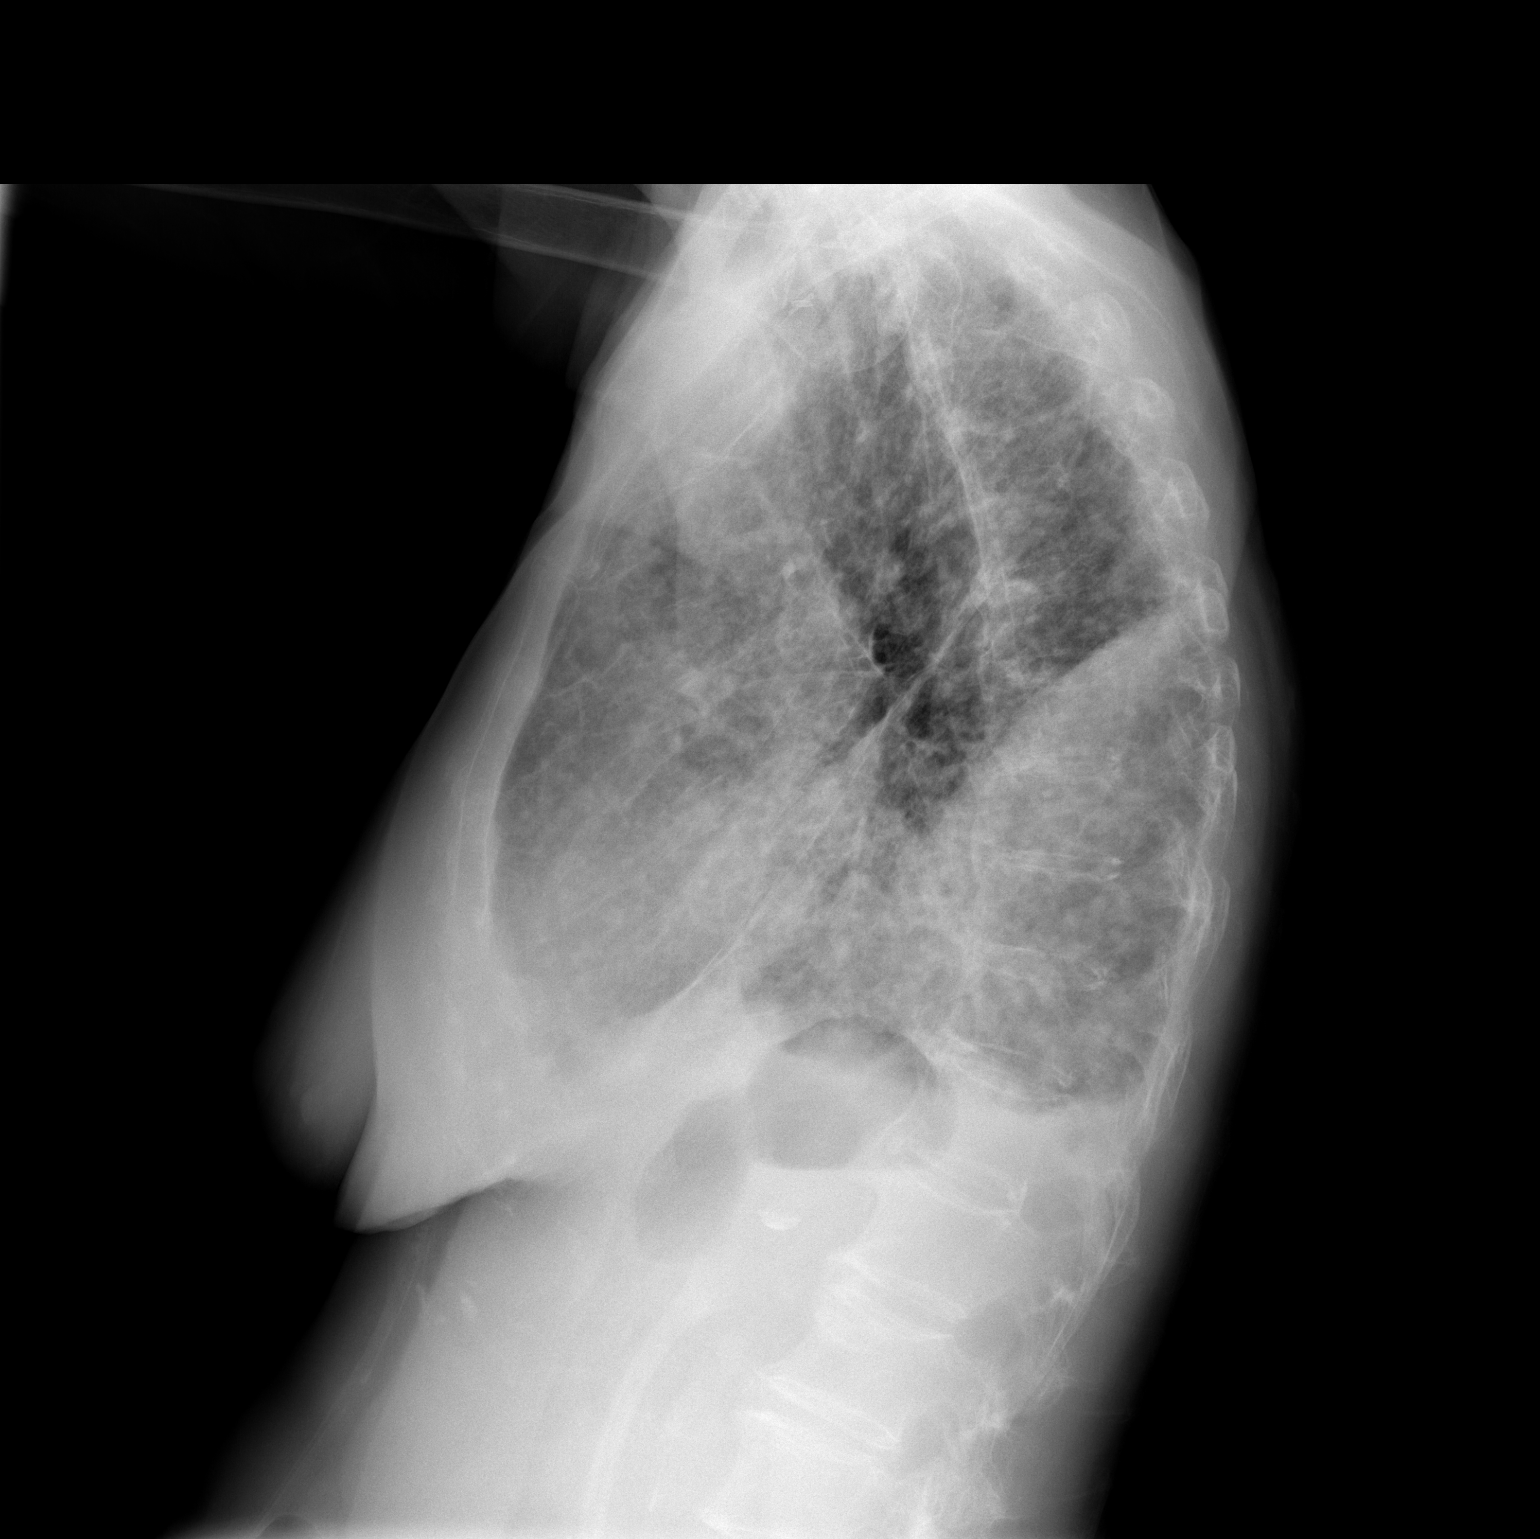

[2 of 2 positions shown; findings below may reference images not displayed]

FINDINGS: Widespread small pulmonary nodules have progressed in the interval.
Left lower lobe density and left pleural effusion have progressed.
Small right pleural effusion has developed since the prior study.

Surgical clips in the region of the thyroid. Atherosclerotic aortic
arch.
IMPRESSION: Progression of widespread pulmonary metastasis with numerous
pulmonary nodules. Progression of left lower lobe density and left
effusion. Small right pleural effusion is new.
